# Patient Record
Sex: Female | Born: 1950 | Race: Black or African American | Hispanic: No | Marital: Married | Smoking: Never smoker
Health system: Southern US, Community
[De-identification: ages and names within clinical notes are randomized; demographics above are authoritative.]

## PROBLEM LIST (undated history)

## (undated) DIAGNOSIS — E119 Type 2 diabetes mellitus without complications: Secondary | ICD-10-CM

## (undated) DIAGNOSIS — E785 Hyperlipidemia, unspecified: Secondary | ICD-10-CM

## (undated) DIAGNOSIS — I10 Essential (primary) hypertension: Secondary | ICD-10-CM

## (undated) DIAGNOSIS — L309 Dermatitis, unspecified: Secondary | ICD-10-CM

## (undated) HISTORY — DX: Essential (primary) hypertension: I10

## (undated) HISTORY — DX: Type 2 diabetes mellitus without complications: E11.9

## (undated) HISTORY — DX: Hyperlipidemia, unspecified: E78.5

---

## 2000-02-10 ENCOUNTER — Encounter: Payer: Self-pay | Admitting: Family Medicine

## 2000-02-10 ENCOUNTER — Encounter: Admission: RE | Admit: 2000-02-10 | Discharge: 2000-02-10 | Payer: Self-pay | Admitting: Family Medicine

## 2000-10-12 ENCOUNTER — Other Ambulatory Visit: Admission: RE | Admit: 2000-10-12 | Discharge: 2000-10-12 | Payer: Self-pay | Admitting: Family Medicine

## 2001-03-14 ENCOUNTER — Encounter: Payer: Self-pay | Admitting: Family Medicine

## 2001-03-14 ENCOUNTER — Encounter: Admission: RE | Admit: 2001-03-14 | Discharge: 2001-03-14 | Payer: Self-pay | Admitting: Family Medicine

## 2003-05-28 ENCOUNTER — Other Ambulatory Visit: Admission: RE | Admit: 2003-05-28 | Discharge: 2003-05-28 | Payer: Self-pay | Admitting: Family Medicine

## 2003-08-01 ENCOUNTER — Emergency Department (HOSPITAL_COMMUNITY): Admission: EM | Admit: 2003-08-01 | Discharge: 2003-08-02 | Payer: Self-pay | Admitting: Emergency Medicine

## 2003-08-14 ENCOUNTER — Encounter (INDEPENDENT_AMBULATORY_CARE_PROVIDER_SITE_OTHER): Payer: Self-pay | Admitting: Specialist

## 2003-08-14 ENCOUNTER — Ambulatory Visit (HOSPITAL_COMMUNITY): Admission: RE | Admit: 2003-08-14 | Discharge: 2003-08-14 | Payer: Self-pay | Admitting: Gastroenterology

## 2003-10-16 ENCOUNTER — Emergency Department (HOSPITAL_COMMUNITY): Admission: EM | Admit: 2003-10-16 | Discharge: 2003-10-16 | Payer: Self-pay | Admitting: Emergency Medicine

## 2004-12-12 ENCOUNTER — Encounter: Admission: RE | Admit: 2004-12-12 | Discharge: 2004-12-12 | Payer: Self-pay | Admitting: Family Medicine

## 2005-01-05 ENCOUNTER — Other Ambulatory Visit: Admission: RE | Admit: 2005-01-05 | Discharge: 2005-01-05 | Payer: Self-pay | Admitting: Family Medicine

## 2005-12-29 ENCOUNTER — Encounter: Admission: RE | Admit: 2005-12-29 | Discharge: 2005-12-29 | Payer: Self-pay | Admitting: Family Medicine

## 2006-05-10 ENCOUNTER — Other Ambulatory Visit: Admission: RE | Admit: 2006-05-10 | Discharge: 2006-05-10 | Payer: Self-pay | Admitting: Family Medicine

## 2006-12-08 ENCOUNTER — Observation Stay (HOSPITAL_COMMUNITY): Admission: EM | Admit: 2006-12-08 | Discharge: 2006-12-09 | Payer: Self-pay | Admitting: Emergency Medicine

## 2007-01-03 ENCOUNTER — Encounter: Admission: RE | Admit: 2007-01-03 | Discharge: 2007-01-03 | Payer: Self-pay | Admitting: Family Medicine

## 2007-01-10 ENCOUNTER — Encounter: Admission: RE | Admit: 2007-01-10 | Discharge: 2007-01-10 | Payer: Self-pay | Admitting: Family Medicine

## 2007-05-16 ENCOUNTER — Other Ambulatory Visit: Admission: RE | Admit: 2007-05-16 | Discharge: 2007-05-16 | Payer: Self-pay | Admitting: Family Medicine

## 2008-03-09 ENCOUNTER — Encounter: Admission: RE | Admit: 2008-03-09 | Discharge: 2008-03-09 | Payer: Self-pay | Admitting: Family Medicine

## 2009-03-18 ENCOUNTER — Encounter: Admission: RE | Admit: 2009-03-18 | Discharge: 2009-03-18 | Payer: Self-pay | Admitting: Family Medicine

## 2010-03-28 ENCOUNTER — Encounter: Admission: RE | Admit: 2010-03-28 | Discharge: 2010-03-28 | Payer: Self-pay | Admitting: Family Medicine

## 2010-10-23 ENCOUNTER — Encounter: Payer: Self-pay | Admitting: Family Medicine

## 2011-02-17 NOTE — H&P (Signed)
Sandra Mccoy, Sandra Mccoy                  ACCOUNT NO.:  [id number]   MEDICAL RECORD NO.:  [id number]          PATIENT TYPE:  OBV   LOCATION:  1826                         FACILITY:  [facility]   PHYSICIAN:  Hollice Espy, M.D.DATE OF BIRTH:  09/17/1951   DATE OF ADMISSION:  12/08/2006  DATE OF DISCHARGE:                              HISTORY & PHYSICAL   PRIMARY CARE PHYSICIAN:  [name], M.D. of [place].   CHIEF COMPLAINT:  Chest discomfort.   HISTORY OF PRESENT ILLNESS:  The patient is a 60 year old African  American female with a past medical history of hypertension,  hyperlipidemia, and hypothyroidism who presents to the emergency room  with complaints of chest pain.  She previously had no other episodes  like this and starting about 3 this morning she woke up with complaints  of pressure located mid sternally radiating up and down.  She had no  associated shortness of breath but she did feel some nausea but no  vomiting, no diaphoresis, no arm or neck radiation.  Symptoms persisted  for about 3 hours and started to abate on her own.  She was finally  convinced by her family to come into the hospital.  They gave her some  nitroglycerin, although symptoms had already come close to resolving  anyway.   An emergency room EKG showed a normal sinus rhythm, essentially  unremarkable.  Cardiac markers were unremarkable as well.  However, she  was noted by lab work to have elevated transaminases with an AST of 643  and an ALT of 338.   REVIEW OF SYSTEMS:  Currently, the patient is doing well.  She denies  any headaches, vision changes, chest pain, palpitations, shortness of  breath, wheeze, cough, abdominal pain, hematuria, dysuria, constipation,  diarrhea, focal extremity numbness, weakness, or pain.  She feels at her  baseline.  Review of systems otherwise negative.   PAST MEDICAL HISTORY:  1. Hypertension.  2. Hyperlipidemia.  3. Hypothyroidism.   MEDICATIONS:  1. Maxzide 10.  2. Synthroid 75.  3. Zetia 10.   ALLERGIES:  SHE HAS NO KNOWN DRUG ALLERGIES.   SOCIAL HISTORY:  She denies any tobacco, alcohol, or drug use.   FAMILY HISTORY:  Noncontributory.   PHYSICAL EXAMINATION:  VITAL SIGNS:  On admission, temp 97.4, heart rate  71, blood pressure 150/75, respirations 16, O2 sat 99% on room air.  GENERAL:  The patient is alert and oriented x3 in no apparent distress.  HEENT:  Normocephalic atraumatic.  Her mucous membranes are moist.  She  has no carotid bruits.  HEART:  Regular rate and rhythm.  S1 S2.  LUNGS:  Clear to auscultation bilaterally.  ABDOMEN:  Soft, nontender, nondistended.  Positive bowel sounds.  EXTREMITIES:  Show no clubbing, cyanosis, or edema.   LABORATORY:  Chest x-ray is unremarkable.  Lipase 32.  LFTs are noted  for an AST 643, ALT 338, total bili 1.4.  Sodium 137, potassium 4,  chloride 108, bicarb 25, BUN 15, creatinine 1, glucose 101.  CPK 55.9,  MB less than 1, troponin I less than 0.05.  EKG and chest x-ray are as  per HPI.   ASSESSMENT AND PLAN:  1. Atypical chest pain.  Given the fact that she is describing      pressure makes me concerned at least to keep the patient overnight      for observation, when we check an EKG as well as serial cardiac      markers.  2. Elevated transaminases.  I question the cause of this given the      patient was changed over from Statin to Zetia about 6 months ago,      and she was intolerant of the Statin for nausea, vomiting, not      because of reported transaminases.  We will repeat a set in the      morning, as long as they do not continue to elevate, we will plan      to just recheck a set and then follow up with Dr. Azucena Cecil as an      outpatient.  If the patient is doing well with no further episodes      of chest pain, she can get an outpatient stress test and be      discharged tomorrow.  3. Hypothyroidism.  Continue Synthroid.  4. Hypertension.  Continue Maxzide.      Hollice Espy, M.D.  Electronically Signed     SKK/MEDQ  D:  12/08/2006  T:  12/08/2006  Job:  [id number]   cc:   [name], M.D.

## 2011-02-17 NOTE — Op Note (Signed)
   NAME:  Sandra Mccoy, Sandra Mccoy                            ACCOUNT NO.:  [id number]   MEDICAL RECORD NO.:  [id number]                   PATIENT TYPE:  AMB   LOCATION:  ENDO                                 [facility]ACILITY[facility]   PHYSICIAN:  John C. Madilyn Fireman, M.D.                 DATE OF BIRTH:  Feb 25, 1951   DATE OF PROCEDURE:  08/14/2003  DATE OF DISCHARGE:                                 OPERATIVE REPORT   PROCEDURE:  Colonoscopy with polypectomy.   INDICATIONS FOR PROCEDURE:  Colon cancer screening in a 60 year old patient  with no previous screening.   DESCRIPTION OF PROCEDURE:  The patient was placed in the left lateral  decubitus position then placed on the pulse monitor with continuous low flow  oxygen delivered by nasal cannula. She was sedated with 65 mcg IV fentanyl  and 7 mg IV Versed. The Olympus video colonoscope was inserted into the  rectum and advanced to the cecum, confirmed by transillumination at  McBurney's point and visualization of the ileocecal valve and appendiceal  orifice. The prep was excellent. The cecum, ascending, transverse,  descending and sigmoid colon all appeared normal with no masses, polyps,  diverticula or other mucosal abnormalities. Within the rectum, there was a 7  mm polyp which was fulgurated by hot biopsy. The remainder of the rectum  appeared normal. The scope was then withdrawn and the patient returned to  the recovery room in stable condition. The patient tolerated the procedure  well and there were no immediate complications.   IMPRESSION:  Rectal polyp otherwise normal study.   PLAN:  Await biopsy results to determine method and interval for future  colon screening.                                               John C. Madilyn Fireman, M.D.    JCH/MEDQ  D:  08/14/2003  T:  08/14/2003  Job:  [id number]   cc:   Meredith Staggers, M.D.  [address], [address]  [place]  [place] [address]  Fax: [phone]

## 2011-03-10 ENCOUNTER — Other Ambulatory Visit: Payer: Self-pay | Admitting: Family Medicine

## 2011-03-10 ENCOUNTER — Other Ambulatory Visit (HOSPITAL_COMMUNITY)
Admission: RE | Admit: 2011-03-10 | Discharge: 2011-03-10 | Disposition: A | Discharge status: Home / Self Care | Payer: 59 | Source: Ambulatory Visit | Attending: Family Medicine | Admitting: Family Medicine

## 2011-03-10 DIAGNOSIS — Z01419 Encounter for gynecological examination (general) (routine) without abnormal findings: Secondary | ICD-10-CM | POA: Insufficient documentation

## 2011-04-10 ENCOUNTER — Other Ambulatory Visit: Payer: Self-pay | Admitting: Family Medicine

## 2011-04-10 DIAGNOSIS — Z1231 Encounter for screening mammogram for malignant neoplasm of breast: Secondary | ICD-10-CM

## 2011-04-28 ENCOUNTER — Ambulatory Visit
Admission: RE | Admit: 2011-04-28 | Discharge: 2011-04-28 | Disposition: A | Discharge status: Home / Self Care | Payer: 59 | Source: Ambulatory Visit | Attending: Family Medicine | Admitting: Family Medicine

## 2011-04-28 DIAGNOSIS — Z1231 Encounter for screening mammogram for malignant neoplasm of breast: Secondary | ICD-10-CM

## 2012-04-05 ENCOUNTER — Other Ambulatory Visit: Payer: Self-pay | Admitting: Family Medicine

## 2012-04-05 DIAGNOSIS — Z1231 Encounter for screening mammogram for malignant neoplasm of breast: Secondary | ICD-10-CM

## 2012-04-29 ENCOUNTER — Ambulatory Visit
Admission: RE | Admit: 2012-04-29 | Discharge: 2012-04-29 | Disposition: A | Discharge status: Home / Self Care | Payer: 59 | Source: Ambulatory Visit | Attending: Family Medicine | Admitting: Family Medicine

## 2012-04-29 DIAGNOSIS — Z1231 Encounter for screening mammogram for malignant neoplasm of breast: Secondary | ICD-10-CM

## 2013-03-21 ENCOUNTER — Other Ambulatory Visit: Payer: Self-pay

## 2013-03-21 DIAGNOSIS — Z1231 Encounter for screening mammogram for malignant neoplasm of breast: Secondary | ICD-10-CM

## 2013-05-01 ENCOUNTER — Ambulatory Visit
Admission: RE | Admit: 2013-05-01 | Discharge: 2013-05-01 | Disposition: A | Discharge status: Home / Self Care | Payer: 59 | Source: Ambulatory Visit

## 2013-05-01 DIAGNOSIS — Z1231 Encounter for screening mammogram for malignant neoplasm of breast: Secondary | ICD-10-CM

## 2013-09-15 ENCOUNTER — Other Ambulatory Visit: Payer: Self-pay | Admitting: Gastroenterology

## 2014-03-17 ENCOUNTER — Other Ambulatory Visit: Payer: Self-pay | Admitting: Family Medicine

## 2014-03-17 ENCOUNTER — Other Ambulatory Visit (HOSPITAL_COMMUNITY)
Admission: RE | Admit: 2014-03-17 | Discharge: 2014-03-17 | Disposition: A | Discharge status: Home / Self Care | Payer: 59 | Source: Ambulatory Visit | Attending: Family Medicine | Admitting: Family Medicine

## 2014-03-17 DIAGNOSIS — Z01419 Encounter for gynecological examination (general) (routine) without abnormal findings: Secondary | ICD-10-CM | POA: Insufficient documentation

## 2014-03-18 LAB — CYTOLOGY - PAP

## 2014-04-16 ENCOUNTER — Encounter: Payer: 59 | Attending: Family Medicine

## 2014-04-16 VITALS — Ht 63.0 in | Wt 154.0 lb

## 2014-04-16 DIAGNOSIS — Z713 Dietary counseling and surveillance: Secondary | ICD-10-CM | POA: Diagnosis present

## 2014-04-16 DIAGNOSIS — E119 Type 2 diabetes mellitus without complications: Secondary | ICD-10-CM | POA: Diagnosis not present

## 2014-04-16 NOTE — Progress Notes (Signed)
Patient was seen on 04/16/2014 for the first of a series of three diabetes self-management courses at the [facility].  Current HbA1c: 6.5%  The following learning objectives were met by the patient during this class:  Describe diabetes  State some common risk factors for diabetes  Defines the role of glucose and insulin  Identifies type of diabetes and pathophysiology  Describe the relationship between diabetes and cardiovascular risk  State the members of the Healthcare Team  States the rationale for glucose monitoring  State when to test glucose  State their individual Target Range  State the importance of logging glucose readings  Describe how to interpret glucose readings  Identifies A1C target  Explain the correlation between A1c and eAG values  State symptoms and treatment of high blood glucose  State symptoms and treatment of low blood glucose  Explain proper technique for glucose testing  Identifies proper sharps disposal  Handouts given during class include:  Living Well with Diabetes book  Carb Counting and Meal Planning book  Meal Plan Card  Carbohydrate guide  Meal planning worksheet  Low Sodium Flavoring Tips  The diabetes portion plate  P3W to eAG Conversion Chart  Diabetes Medications  Diabetes Recommended Care Schedule  Support Group  Diabetes Success Plan  Core Class Satisfaction Survey  Follow-Up Plan:  Attend core 2

## 2014-04-20 ENCOUNTER — Other Ambulatory Visit: Payer: Self-pay

## 2014-04-20 DIAGNOSIS — Z1231 Encounter for screening mammogram for malignant neoplasm of breast: Secondary | ICD-10-CM

## 2014-04-23 DIAGNOSIS — E119 Type 2 diabetes mellitus without complications: Secondary | ICD-10-CM

## 2014-04-23 NOTE — Progress Notes (Signed)

## 2014-04-30 DIAGNOSIS — E119 Type 2 diabetes mellitus without complications: Secondary | ICD-10-CM | POA: Diagnosis not present

## 2014-04-30 NOTE — Progress Notes (Signed)
Patient was seen on 04/30/14 for the third of a series of three diabetes self-management courses at the [facility]. The following learning objectives were met by the patient during this class:    State the amount of activity recommended for healthy living   Describe activities suitable for individual needs   Identify ways to regularly incorporate activity into daily life   Identify barriers to activity and ways to over come these barriers  Identify diabetes medications being personally used and their primary action for lowering glucose and possible side effects   Describe role of stress on blood glucose and develop strategies to address psychosocial issues   Identify diabetes complications and ways to prevent them  Explain how to manage diabetes during illness   Evaluate success in meeting personal goal   Establish 2-3 goals that they will plan to diligently work on until they return for the  71-monthfollow-up visit  Goals:  Follow Diabetes Meal Plan as instructed  Aim for 15-30 mins of physical activity daily as tolerated  Bring food record and glucose log to your follow up visit  Your patient has established the following 4 month goals in their individualized success plan: I will count my carb choices at most meals and snacks To help manage stress I will do reflect at least 1 times a week  Your patient has identified these potential barriers to change:  None stated  Your patient has identified their diabetes self-care support plan as  [facility]   Plan:  Attend Core 4 in 4 months

## 2014-05-04 ENCOUNTER — Ambulatory Visit
Admission: RE | Admit: 2014-05-04 | Discharge: 2014-05-04 | Disposition: A | Discharge status: Home / Self Care | Payer: 59 | Source: Ambulatory Visit

## 2014-05-04 DIAGNOSIS — Z1231 Encounter for screening mammogram for malignant neoplasm of breast: Secondary | ICD-10-CM

## 2014-08-03 ENCOUNTER — Ambulatory Visit: Payer: 59

## 2015-04-08 ENCOUNTER — Other Ambulatory Visit: Payer: Self-pay

## 2015-04-08 DIAGNOSIS — Z1231 Encounter for screening mammogram for malignant neoplasm of breast: Secondary | ICD-10-CM

## 2015-05-06 ENCOUNTER — Ambulatory Visit
Admission: RE | Admit: 2015-05-06 | Discharge: 2015-05-06 | Disposition: A | Discharge status: Home / Self Care | Payer: 59 | Source: Ambulatory Visit

## 2015-05-06 DIAGNOSIS — Z1231 Encounter for screening mammogram for malignant neoplasm of breast: Secondary | ICD-10-CM

## 2016-05-03 ENCOUNTER — Other Ambulatory Visit: Payer: Self-pay | Admitting: Family Medicine

## 2016-05-03 DIAGNOSIS — Z1231 Encounter for screening mammogram for malignant neoplasm of breast: Secondary | ICD-10-CM

## 2016-05-11 ENCOUNTER — Ambulatory Visit
Admission: RE | Admit: 2016-05-11 | Discharge: 2016-05-11 | Disposition: A | Discharge status: Home / Self Care | Payer: 59 | Source: Ambulatory Visit | Attending: Family Medicine | Admitting: Family Medicine

## 2016-05-11 DIAGNOSIS — Z1231 Encounter for screening mammogram for malignant neoplasm of breast: Secondary | ICD-10-CM

## 2017-04-25 ENCOUNTER — Other Ambulatory Visit: Payer: Self-pay | Admitting: Family Medicine

## 2017-04-25 DIAGNOSIS — Z1231 Encounter for screening mammogram for malignant neoplasm of breast: Secondary | ICD-10-CM

## 2017-05-16 ENCOUNTER — Ambulatory Visit: Payer: 59

## 2017-05-30 ENCOUNTER — Ambulatory Visit
Admission: RE | Admit: 2017-05-30 | Discharge: 2017-05-30 | Disposition: A | Discharge status: Home / Self Care | Payer: Medicare Other | Source: Ambulatory Visit | Attending: Family Medicine | Admitting: Family Medicine

## 2017-05-30 DIAGNOSIS — Z1231 Encounter for screening mammogram for malignant neoplasm of breast: Secondary | ICD-10-CM

## 2017-07-13 ENCOUNTER — Encounter: Payer: Self-pay | Admitting: Emergency Medicine

## 2017-07-13 ENCOUNTER — Ambulatory Visit (INDEPENDENT_AMBULATORY_CARE_PROVIDER_SITE_OTHER): Payer: Medicare Other | Admitting: Emergency Medicine

## 2017-07-13 VITALS — BP 126/64 | HR 111 | Temp 98.6°F | Resp 16 | Ht 62.5 in | Wt 160.0 lb

## 2017-07-13 DIAGNOSIS — J069 Acute upper respiratory infection, unspecified: Secondary | ICD-10-CM

## 2017-07-13 DIAGNOSIS — I10 Essential (primary) hypertension: Secondary | ICD-10-CM

## 2017-07-13 DIAGNOSIS — R05 Cough: Secondary | ICD-10-CM

## 2017-07-13 DIAGNOSIS — E119 Type 2 diabetes mellitus without complications: Secondary | ICD-10-CM | POA: Diagnosis not present

## 2017-07-13 DIAGNOSIS — R059 Cough, unspecified: Secondary | ICD-10-CM | POA: Insufficient documentation

## 2017-07-13 DIAGNOSIS — J029 Acute pharyngitis, unspecified: Secondary | ICD-10-CM | POA: Diagnosis not present

## 2017-07-13 DIAGNOSIS — E785 Hyperlipidemia, unspecified: Secondary | ICD-10-CM | POA: Diagnosis not present

## 2017-07-13 MED ORDER — PROMETHAZINE-CODEINE 6.25-10 MG/5ML PO SYRP: 5.0000 mL | ORAL_SOLUTION | Freq: Every evening | ORAL | 0 refills | Status: DC | PRN

## 2017-07-13 NOTE — Progress Notes (Signed)
Sandra Mccoy 66 y.o.   Chief Complaint  Patient presents with  . Cough    X 2-3 DAYS  . Sore Throat    HISTORY OF PRESENT ILLNESS: This is a 66 y.o. female complaining of sore throat and cough x 2-3 days.  Sore Throat   This is a new problem. The current episode started in the past 7 days. The problem has been gradually improving. There has been no fever. The pain is at a severity of 3/10. The pain is mild. Associated symptoms include coughing. Pertinent negatives include no abdominal pain, congestion, diarrhea, ear pain, headaches, neck pain, shortness of breath, swollen glands, trouble swallowing or vomiting. She has had no exposure to strep or mono. She has tried nothing for the symptoms.     Prior to Admission medications   Not on File    Allergies  Allergen Reactions  . Lipitor [Atorvastatin] Nausea And Vomiting    There are no active problems to display for this patient.   Past Medical History:  Diagnosis Date  . Diabetes mellitus without complication (HCC)   . Hyperlipidemia   . Hypertension     No past surgical history on file.  Social History   Social History  . Marital status: Married    Spouse name: N/A  . Number of children: N/A  . Years of education: N/A   Occupational History  . Not on file.   Social History Main Topics  . Smoking status: Never Smoker  . Smokeless tobacco: Never Used  . Alcohol use No  . Drug use: No  . Sexual activity: Not on file   Other Topics Concern  . Not on file   Social History Narrative  . No narrative on file    No family history on file.   Review of Systems  Constitutional: Negative.  Negative for chills, fever and malaise/fatigue.  HENT: Positive for sore throat. Negative for congestion, ear pain, nosebleeds and trouble swallowing.   Eyes: Negative for discharge and redness.  Respiratory: Positive for cough. Negative for hemoptysis and shortness of breath.   Cardiovascular: Negative for chest pain,  palpitations, claudication and leg swelling.  Gastrointestinal: Negative for abdominal pain, blood in stool, diarrhea, nausea and vomiting.  Genitourinary: Negative for dysuria, flank pain and hematuria.  Musculoskeletal: Negative for joint pain, myalgias and neck pain.  Skin: Negative for rash.  Neurological: Negative for dizziness, sensory change, focal weakness and headaches.  All other systems reviewed and are negative.    Vitals:   07/13/17 1352  BP: 126/64  Pulse: (!) 111  Resp: 16  Temp: 98.6 F (37 C)  SpO2: 97%     Physical Exam  Constitutional: She is oriented to person, place, and time. She appears well-developed and well-nourished.  HENT:  Head: Normocephalic and atraumatic.  Mouth/Throat: Uvula is midline and mucous membranes are normal. Posterior oropharyngeal erythema present. No oropharyngeal exudate or posterior oropharyngeal edema.  Eyes: Pupils are equal, round, and reactive to light. Conjunctivae and EOM are normal.  Neck: Normal range of motion. Neck supple. No JVD present. No thyromegaly present.  Cardiovascular: Normal rate, regular rhythm, normal heart sounds and intact distal pulses.   Pulmonary/Chest: Effort normal and breath sounds normal.  Abdominal: Soft.  Lymphadenopathy:    She has no cervical adenopathy.  Neurological: She is alert and oriented to person, place, and time. No sensory deficit. She exhibits normal muscle tone.  Skin: Skin is warm and dry. Capillary refill takes less than 2  seconds. No rash noted.  Psychiatric: She has a normal mood and affect. Her behavior is normal.  Vitals reviewed.    ASSESSMENT & PLAN: Sandra Mccoy was seen today for cough and sore throat.  Diagnoses and all orders for this visit:  Upper respiratory tract infection, unspecified type  Sore throat  Cough  Other orders -     promethazine-codeine (PHENERGAN WITH CODEINE) 6.25-10 MG/5ML syrup; Take 5 mLs by mouth at bedtime as needed for cough.    Patient  Instructions  Upper Respiratory Infection, Adult Most upper respiratory infections (URIs) are caused by a virus. A URI affects the nose, throat, and upper air passages. The most common type of URI is often called "the common cold." Follow these instructions at home:  Take medicines only as told by your doctor.  Gargle warm saltwater or take cough drops to comfort your throat as told by your doctor.  Use a warm mist humidifier or inhale steam from a shower to increase air moisture. This may make it easier to breathe.  Drink enough fluid to keep your pee (urine) clear or pale yellow.  Eat soups and other clear broths.  Have a healthy diet.  Rest as needed.  Go back to work when your fever is gone or your doctor says it is okay. ? You may need to stay home longer to avoid giving your URI to others. ? You can also wear a face mask and wash your hands often to prevent spread of the virus.  Use your inhaler more if you have asthma.  Do not use any tobacco products, including cigarettes, chewing tobacco, or electronic cigarettes. If you need help quitting, ask your doctor. Contact a doctor if:  You are getting worse, not better.  Your symptoms are not helped by medicine.  You have chills.  You are getting more short of breath.  You have brown or red mucus.  You have yellow or brown discharge from your nose.  You have pain in your face, especially when you bend forward.  You have a fever.  You have puffy (swollen) neck glands.  You have pain while swallowing.  You have white areas in the back of your throat. Get help right away if:  You have very bad or constant: ? Headache. ? Ear pain. ? Pain in your forehead, behind your eyes, and over your cheekbones (sinus pain). ? Chest pain.  You have long-lasting (chronic) lung disease and any of the following: ? Wheezing. ? Long-lasting cough. ? Coughing up blood. ? A change in your usual mucus.  You have a stiff  neck.  You have changes in your: ? Vision. ? Hearing. ? Thinking. ? Mood. This information is not intended to replace advice given to you by your health care provider. Make sure you discuss any questions you have with your health care provider. Document Released: 03/06/2008 Document Revised: 05/21/2016 Document Reviewed: 12/24/2013 Elsevier Interactive Patient Education  2018 Elsevier Inc.      Edwina Barth, MD [facility]

## 2017-07-13 NOTE — Patient Instructions (Signed)
Upper Respiratory Infection, Adult Most upper respiratory infections (URIs) are caused by a virus. A URI affects the nose, throat, and upper air passages. The most common type of URI is often called "the common cold." Follow these instructions at home:  Take medicines only as told by your doctor.  Gargle warm saltwater or take cough drops to comfort your throat as told by your doctor.  Use a warm mist humidifier or inhale steam from a shower to increase air moisture. This may make it easier to breathe.  Drink enough fluid to keep your pee (urine) clear or pale yellow.  Eat soups and other clear broths.  Have a healthy diet.  Rest as needed.  Go back to work when your fever is gone or your doctor says it is okay. ? You may need to stay home longer to avoid giving your URI to others. ? You can also wear a face mask and wash your hands often to prevent spread of the virus.  Use your inhaler more if you have asthma.  Do not use any tobacco products, including cigarettes, chewing tobacco, or electronic cigarettes. If you need help quitting, ask your doctor. Contact a doctor if:  You are getting worse, not better.  Your symptoms are not helped by medicine.  You have chills.  You are getting more short of breath.  You have brown or red mucus.  You have yellow or brown discharge from your nose.  You have pain in your face, especially when you bend forward.  You have a fever.  You have puffy (swollen) neck glands.  You have pain while swallowing.  You have white areas in the back of your throat. Get help right away if:  You have very bad or constant: ? Headache. ? Ear pain. ? Pain in your forehead, behind your eyes, and over your cheekbones (sinus pain). ? Chest pain.  You have long-lasting (chronic) lung disease and any of the following: ? Wheezing. ? Long-lasting cough. ? Coughing up blood. ? A change in your usual mucus.  You have a stiff neck.  You have  changes in your: ? Vision. ? Hearing. ? Thinking. ? Mood. This information is not intended to replace advice given to you by your health care provider. Make sure you discuss any questions you have with your health care provider. Document Released: 03/06/2008 Document Revised: 05/21/2016 Document Reviewed: 12/24/2013 Elsevier Interactive Patient Education  2018 Elsevier Inc.  

## 2018-04-22 ENCOUNTER — Other Ambulatory Visit: Payer: Self-pay | Admitting: Family Medicine

## 2018-04-22 DIAGNOSIS — Z1231 Encounter for screening mammogram for malignant neoplasm of breast: Secondary | ICD-10-CM

## 2018-06-12 ENCOUNTER — Ambulatory Visit
Admission: RE | Admit: 2018-06-12 | Discharge: 2018-06-12 | Disposition: A | Discharge status: Home / Self Care | Payer: Medicare Other | Source: Ambulatory Visit | Attending: Family Medicine | Admitting: Family Medicine

## 2018-06-12 ENCOUNTER — Encounter: Payer: Self-pay | Admitting: Radiology

## 2018-06-12 DIAGNOSIS — Z1231 Encounter for screening mammogram for malignant neoplasm of breast: Secondary | ICD-10-CM

## 2019-03-25 ENCOUNTER — Other Ambulatory Visit: Payer: Self-pay | Admitting: *Deleted

## 2019-03-25 DIAGNOSIS — Z20822 Contact with and (suspected) exposure to covid-19: Secondary | ICD-10-CM

## 2019-03-28 LAB — NOVEL CORONAVIRUS, NAA: SARS-CoV-2, NAA: NOT DETECTED

## 2019-06-10 ENCOUNTER — Other Ambulatory Visit: Payer: Self-pay | Admitting: Family Medicine

## 2019-06-10 DIAGNOSIS — Z1231 Encounter for screening mammogram for malignant neoplasm of breast: Secondary | ICD-10-CM

## 2019-07-22 ENCOUNTER — Ambulatory Visit
Admission: RE | Admit: 2019-07-22 | Discharge: 2019-07-22 | Disposition: A | Discharge status: Home / Self Care | Payer: Medicare Other | Source: Ambulatory Visit | Attending: Family Medicine | Admitting: Family Medicine

## 2019-07-22 ENCOUNTER — Other Ambulatory Visit: Payer: Self-pay

## 2019-07-22 DIAGNOSIS — Z1231 Encounter for screening mammogram for malignant neoplasm of breast: Secondary | ICD-10-CM

## 2019-08-07 ENCOUNTER — Other Ambulatory Visit: Payer: Self-pay

## 2019-08-07 DIAGNOSIS — Z20822 Contact with and (suspected) exposure to covid-19: Secondary | ICD-10-CM

## 2019-08-09 LAB — NOVEL CORONAVIRUS, NAA: SARS-CoV-2, NAA: NOT DETECTED

## 2019-08-18 ENCOUNTER — Telehealth: Payer: Self-pay | Admitting: *Deleted

## 2019-08-18 NOTE — Telephone Encounter (Signed)
Patient called and was given negative COVID results . 

## 2019-10-26 ENCOUNTER — Ambulatory Visit: Payer: Medicare Other | Attending: Internal Medicine

## 2019-10-26 DIAGNOSIS — Z23 Encounter for immunization: Secondary | ICD-10-CM | POA: Insufficient documentation

## 2019-10-27 NOTE — Progress Notes (Signed)
   Covid-19 Vaccination Clinic  Name:  Sandra Mccoy    MRN: [id number] DOB: 02-19-1951  10/26/2019  Sandra Mccoy was observed post Covid-19 immunization for 15 minutes without incidence. She was provided with Vaccine Information Sheet and instruction to access the V-Safe system.   Sandra Mccoy was instructed to call 911 with any severe reactions post vaccine: Marland Kitchen Difficulty breathing  . Swelling of your face and throat  . A fast heartbeat  . A bad rash all over your body  . Dizziness and weakness    Immunizations Administered    Name Date Dose VIS Date Route   Moderna COVID-19 Vaccine 10/26/2019  3:03 PM 0.5 mL 09/02/2019 Intramuscular   Manufacturer: Gala Murdoch   Lot: [id number]   NDC: 08022-336-12      Documented on behalf of: C. Jimmey Ralph

## 2019-11-23 ENCOUNTER — Ambulatory Visit: Payer: Medicare Other | Attending: Internal Medicine

## 2019-11-23 DIAGNOSIS — Z23 Encounter for immunization: Secondary | ICD-10-CM | POA: Insufficient documentation

## 2019-11-23 NOTE — Progress Notes (Signed)
   Covid-19 Vaccination Clinic  Name:  Sandra Mccoy    MRN: [id number] DOB: 03/06/1951  11/23/2019  [name] was observed post Covid-19 immunization for 15 minutes without incidence. She was provided with Vaccine Information Sheet and instruction to access the V-Safe system.   [name] was instructed to call 911 with any severe reactions post vaccine: Marland Kitchen Difficulty breathing  . Swelling of your face and throat  . A fast heartbeat  . A bad rash all over your body  . Dizziness and weakness    Immunizations Administered    Name Date Dose VIS Date Route   Moderna COVID-19 Vaccine 11/23/2019  1:15 PM 0.5 mL 09/02/2019 Intramuscular   Manufacturer: Moderna   Lot: [id number]   NDC: 77373-668-15

## 2019-11-27 ENCOUNTER — Other Ambulatory Visit: Payer: Self-pay | Admitting: Family Medicine

## 2019-11-27 ENCOUNTER — Ambulatory Visit
Admission: RE | Admit: 2019-11-27 | Discharge: 2019-11-27 | Disposition: A | Discharge status: Home / Self Care | Payer: Medicare Other | Source: Ambulatory Visit | Attending: Family Medicine | Admitting: Family Medicine

## 2019-11-27 DIAGNOSIS — R319 Hematuria, unspecified: Secondary | ICD-10-CM

## 2019-11-27 IMAGING — US US RENAL
1 series · 14 of 25 positions shown · non-contrast
Comparison: None.

CLINICAL DATA: Hematuria.

EXAM:
RENAL / URINARY TRACT ULTRASOUND COMPLETE

[Series 1: us renal · 0.20mm/px · 14 of 32 slices shown]
[im 1/32]
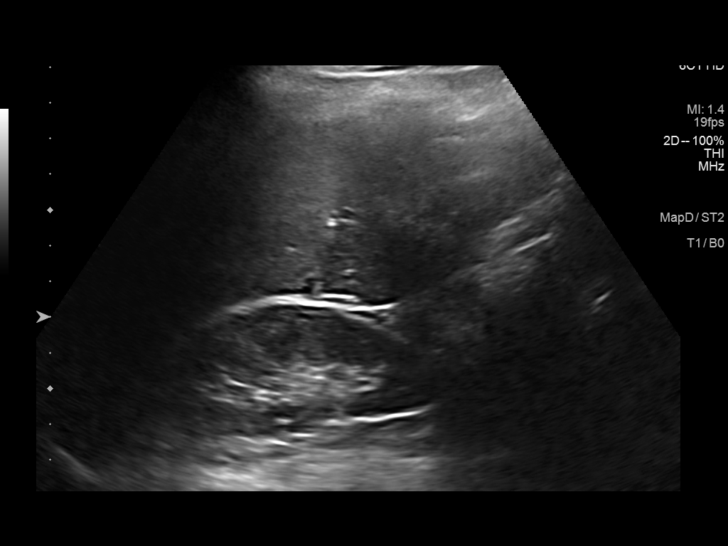
[im 3/32]
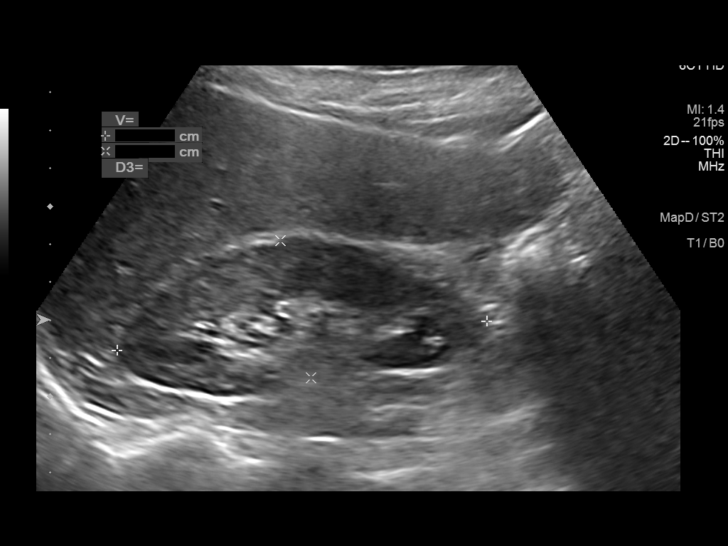
[im 6/32]
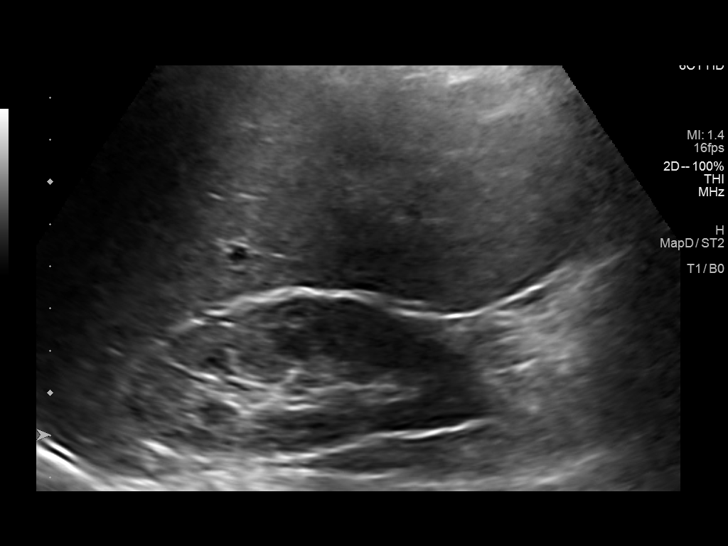
[im 8/32]
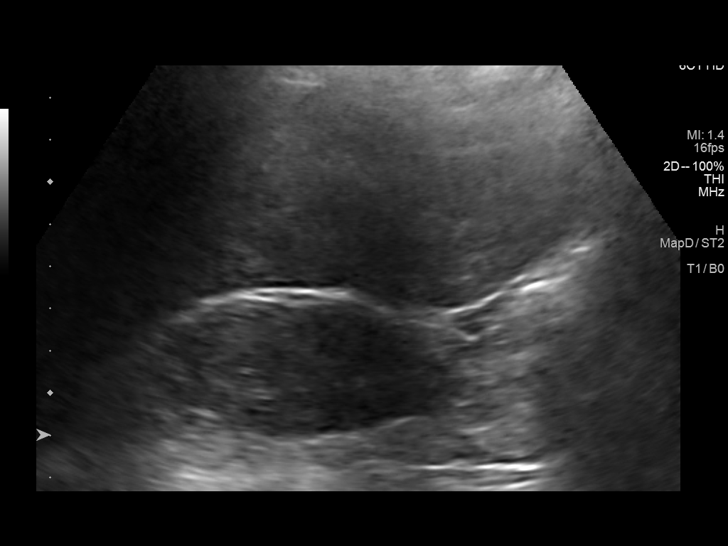
[im 11/32]
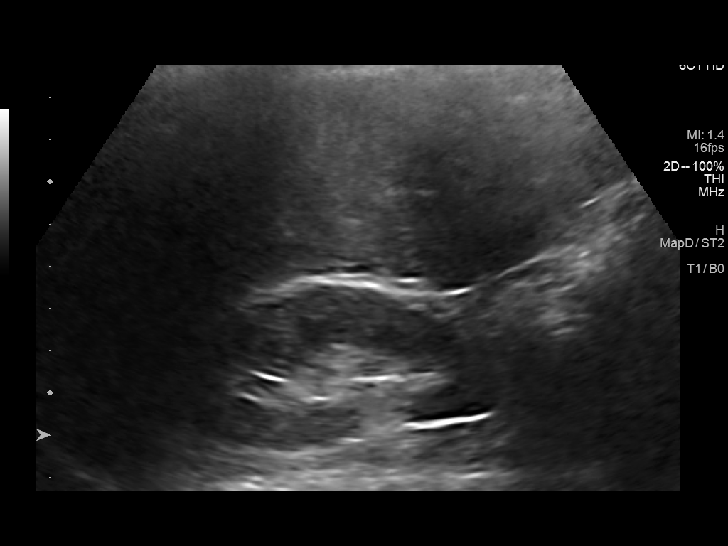
[im 12/32]
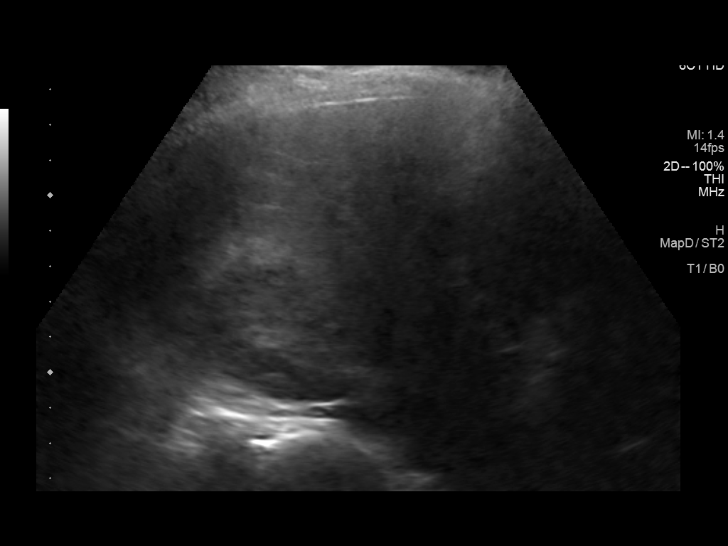
[im 15/32]
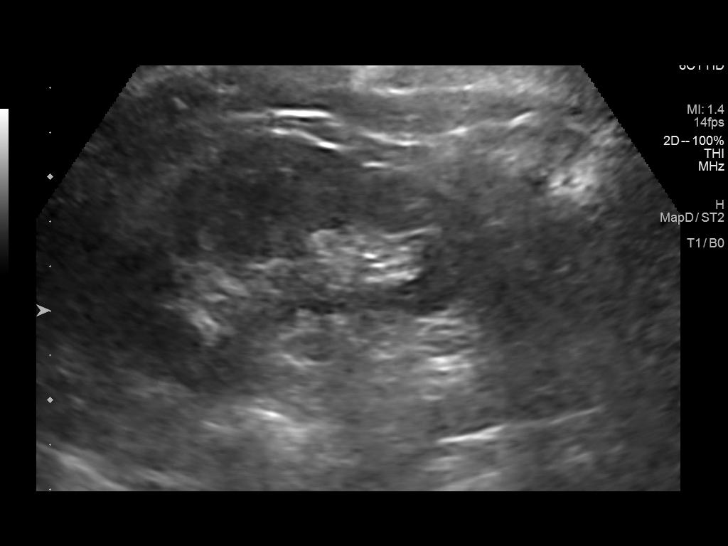
[im 17/32]
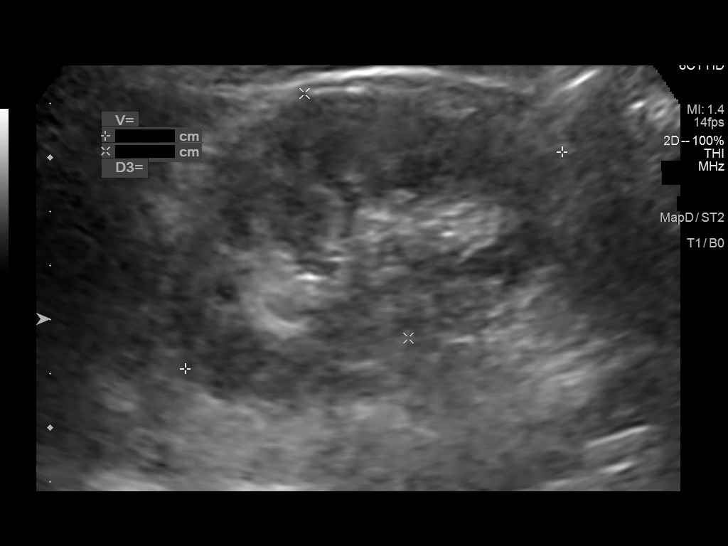
[im 20/32]
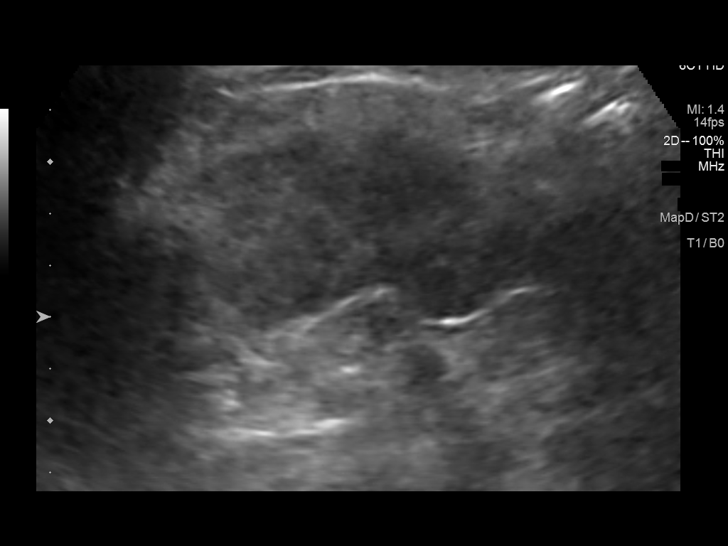
[im 21/32]
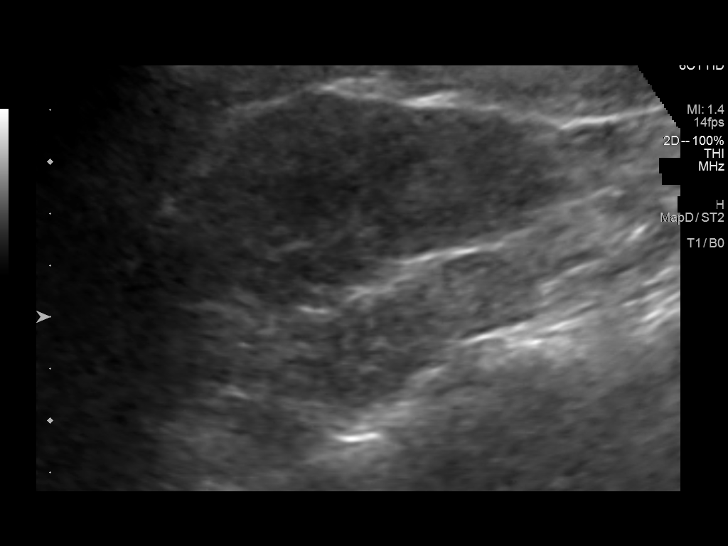
[im 24/32]
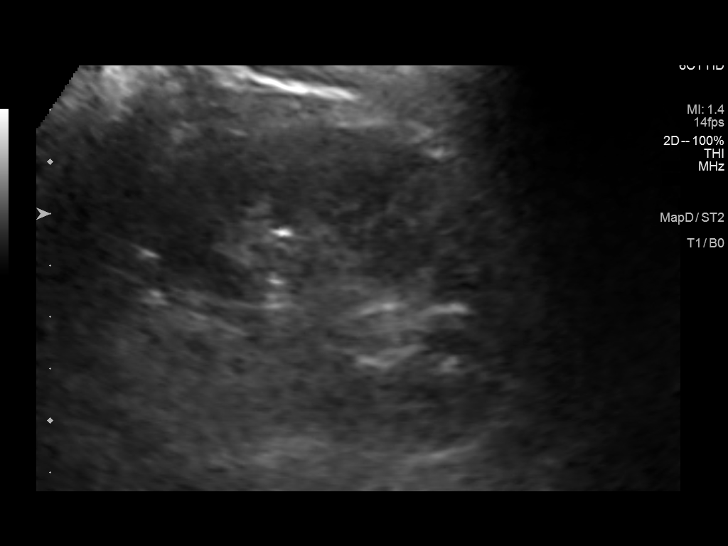
[im 26/32]
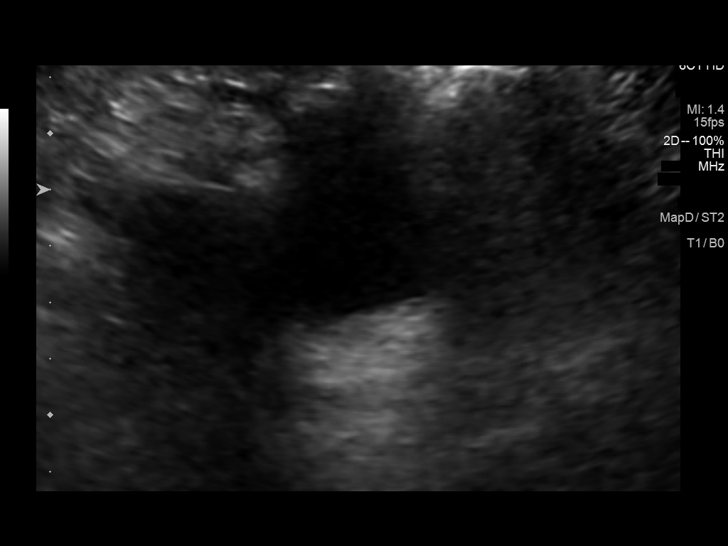
[im 29/32]
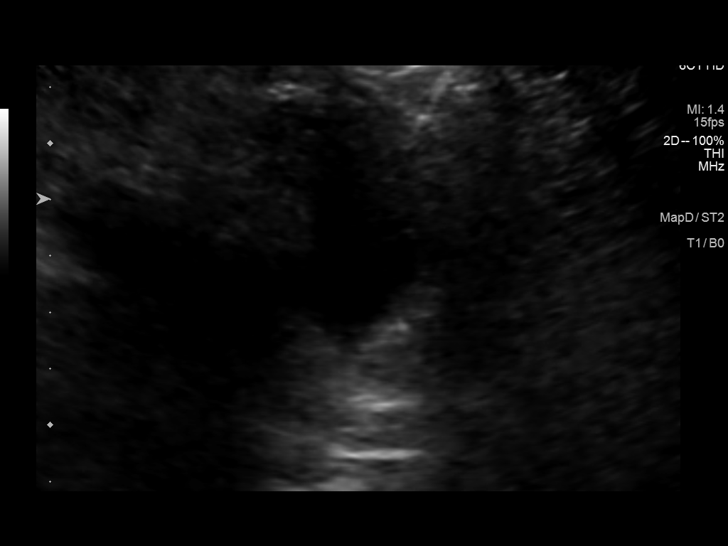
[im 32/32]
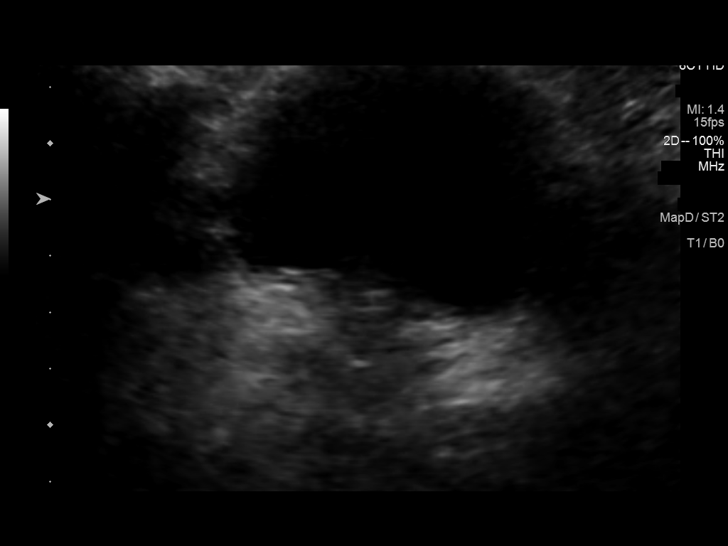

[14 of 25 positions shown; findings below may reference images not displayed]

FINDINGS: Right Kidney:

Renal measurements: 9.8 x 3.7 x 4.7 cm = volume: 89.6 mL .
Echogenicity within normal limits. No mass or hydronephrosis
visualized.

Left Kidney:

Renal measurements: 8.1 x 4.9 x 4.8 cm = volume: 98.9 mL.
Echogenicity within normal limits. No mass or hydronephrosis
visualized.

Bladder:

Appears normal for degree of bladder distention.

Other:

None.
IMPRESSION: Normal study.  No acute abnormality detected.

## 2020-06-29 ENCOUNTER — Other Ambulatory Visit: Payer: Self-pay | Admitting: Family Medicine

## 2020-06-29 DIAGNOSIS — Z0000 Encounter for general adult medical examination without abnormal findings: Secondary | ICD-10-CM

## 2020-07-23 ENCOUNTER — Ambulatory Visit
Admission: RE | Admit: 2020-07-23 | Discharge: 2020-07-23 | Disposition: A | Discharge status: Home / Self Care | Payer: Medicare Other | Source: Ambulatory Visit | Attending: Family Medicine | Admitting: Family Medicine

## 2020-07-23 ENCOUNTER — Other Ambulatory Visit: Payer: Self-pay

## 2020-07-23 DIAGNOSIS — Z0000 Encounter for general adult medical examination without abnormal findings: Secondary | ICD-10-CM

## 2021-01-01 ENCOUNTER — Inpatient Hospital Stay (HOSPITAL_BASED_OUTPATIENT_CLINIC_OR_DEPARTMENT_OTHER)
Admission: EM | Admit: 2021-01-01 | Discharge: 2021-01-04 | DRG: 418 | Disposition: A | Discharge status: Home / Self Care | Payer: Medicare Other | Attending: Internal Medicine | Admitting: Internal Medicine

## 2021-01-01 ENCOUNTER — Encounter (HOSPITAL_BASED_OUTPATIENT_CLINIC_OR_DEPARTMENT_OTHER): Payer: Self-pay | Admitting: Emergency Medicine

## 2021-01-01 ENCOUNTER — Emergency Department (HOSPITAL_BASED_OUTPATIENT_CLINIC_OR_DEPARTMENT_OTHER): Payer: Medicare Other

## 2021-01-01 ENCOUNTER — Other Ambulatory Visit: Payer: Self-pay

## 2021-01-01 DIAGNOSIS — K8020 Calculus of gallbladder without cholecystitis without obstruction: Secondary | ICD-10-CM

## 2021-01-01 DIAGNOSIS — E785 Hyperlipidemia, unspecified: Secondary | ICD-10-CM | POA: Diagnosis present

## 2021-01-01 DIAGNOSIS — Z20822 Contact with and (suspected) exposure to covid-19: Secondary | ICD-10-CM | POA: Diagnosis present

## 2021-01-01 DIAGNOSIS — K819 Cholecystitis, unspecified: Secondary | ICD-10-CM | POA: Diagnosis present

## 2021-01-01 DIAGNOSIS — E039 Hypothyroidism, unspecified: Secondary | ICD-10-CM | POA: Diagnosis present

## 2021-01-01 DIAGNOSIS — K810 Acute cholecystitis: Secondary | ICD-10-CM | POA: Diagnosis present

## 2021-01-01 DIAGNOSIS — E119 Type 2 diabetes mellitus without complications: Secondary | ICD-10-CM | POA: Diagnosis present

## 2021-01-01 DIAGNOSIS — K429 Umbilical hernia without obstruction or gangrene: Secondary | ICD-10-CM | POA: Diagnosis present

## 2021-01-01 DIAGNOSIS — Z79899 Other long term (current) drug therapy: Secondary | ICD-10-CM

## 2021-01-01 DIAGNOSIS — E871 Hypo-osmolality and hyponatremia: Secondary | ICD-10-CM | POA: Diagnosis present

## 2021-01-01 DIAGNOSIS — R1011 Right upper quadrant pain: Secondary | ICD-10-CM

## 2021-01-01 DIAGNOSIS — I10 Essential (primary) hypertension: Secondary | ICD-10-CM | POA: Diagnosis present

## 2021-01-01 DIAGNOSIS — Z79890 Hormone replacement therapy (postmenopausal): Secondary | ICD-10-CM

## 2021-01-01 DIAGNOSIS — Z419 Encounter for procedure for purposes other than remedying health state, unspecified: Secondary | ICD-10-CM

## 2021-01-01 DIAGNOSIS — K8012 Calculus of gallbladder with acute and chronic cholecystitis without obstruction: Principal | ICD-10-CM | POA: Diagnosis present

## 2021-01-01 HISTORY — DX: Acute cholecystitis: K81.0

## 2021-01-01 LAB — CBC WITH DIFFERENTIAL/PLATELET
Abs Immature Granulocytes: 0.03 10*3/uL (ref 0.00–0.07)
Basophils Absolute: 0 10*3/uL (ref 0.0–0.1)
Basophils Relative: 0 %
Eosinophils Absolute: 0 10*3/uL (ref 0.0–0.5)
Eosinophils Relative: 0 %
HCT: 39.1 % (ref 36.0–46.0)
Hemoglobin: 13.6 g/dL (ref 12.0–15.0)
Immature Granulocytes: 0 %
Lymphocytes Relative: 21 %
Lymphs Abs: 2.2 10*3/uL (ref 0.7–4.0)
MCH: 31.6 pg (ref 26.0–34.0)
MCHC: 34.8 g/dL (ref 30.0–36.0)
MCV: 90.7 fL (ref 80.0–100.0)
Monocytes Absolute: 0.5 10*3/uL (ref 0.1–1.0)
Monocytes Relative: 5 %
Neutro Abs: 7.7 10*3/uL (ref 1.7–7.7)
Neutrophils Relative %: 74 %
Platelets: 235 10*3/uL (ref 150–400)
RBC: 4.31 MIL/uL (ref 3.87–5.11)
RDW: 12.9 % (ref 11.5–15.5)
WBC: 10.4 10*3/uL (ref 4.0–10.5)
nRBC: 0 % (ref 0.0–0.2)

## 2021-01-01 LAB — COMPREHENSIVE METABOLIC PANEL
ALT: 184 U/L — ABNORMAL HIGH (ref 0–44)
AST: 96 U/L — ABNORMAL HIGH (ref 15–41)
Albumin: 4.3 g/dL (ref 3.5–5.0)
Alkaline Phosphatase: 136 U/L — ABNORMAL HIGH (ref 38–126)
Anion gap: 12 (ref 5–15)
BUN: 16 mg/dL (ref 8–23)
CO2: 26 mmol/L (ref 22–32)
Calcium: 10.9 mg/dL — ABNORMAL HIGH (ref 8.9–10.3)
Chloride: 96 mmol/L — ABNORMAL LOW (ref 98–111)
Creatinine, Ser: 0.95 mg/dL (ref 0.44–1.00)
GFR, Estimated: >60 mL/min (ref >60)
Glucose, Bld: 149 mg/dL — ABNORMAL HIGH (ref 70–99)
Potassium: 4.2 mmol/L (ref 3.5–5.1)
Sodium: 134 mmol/L — ABNORMAL LOW (ref 135–145)
Total Bilirubin: 1 mg/dL (ref 0.3–1.2)
Total Protein: 8.5 g/dL — ABNORMAL HIGH (ref 6.5–8.1)

## 2021-01-01 LAB — RESP PANEL BY RT-PCR (FLU A&B, COVID) ARPGX2
Influenza A by PCR: NEGATIVE
Influenza B by PCR: NEGATIVE
SARS Coronavirus 2 by RT PCR: NEGATIVE

## 2021-01-01 LAB — CBG MONITORING, ED: Glucose-Capillary: 109 mg/dL — ABNORMAL HIGH (ref 70–99)

## 2021-01-01 LAB — URINALYSIS, MICROSCOPIC (REFLEX)

## 2021-01-01 LAB — URINALYSIS, ROUTINE W REFLEX MICROSCOPIC
Glucose, UA: NEGATIVE mg/dL
Hgb urine dipstick: NEGATIVE
Ketones, ur: >80 mg/dL — AB
Nitrite: NEGATIVE
Protein, ur: NEGATIVE mg/dL
Specific Gravity, Urine: 1.025 (ref 1.005–1.030)
pH: 7 (ref 5.0–8.0)

## 2021-01-01 LAB — LIPASE, BLOOD: Lipase: 31 U/L (ref 11–51)

## 2021-01-01 MED ORDER — ONDANSETRON HCL 4 MG/2ML IJ SOLN
4.0000 mg | Freq: Once | INTRAMUSCULAR | Status: AC
  Administered 2021-01-01: 4 mg via INTRAVENOUS
  Filled 2021-01-01: qty 2

## 2021-01-01 MED ORDER — ONDANSETRON HCL 4 MG PO TABS: 4.0000 mg | ORAL_TABLET | Freq: Four times a day (QID) | ORAL | Status: DC | PRN

## 2021-01-01 MED ORDER — OXYCODONE HCL 5 MG PO TABS: 5.0000 mg | ORAL_TABLET | ORAL | Status: DC | PRN

## 2021-01-01 MED ORDER — MORPHINE SULFATE (PF) 2 MG/ML IV SOLN: 2.0000 mg | INTRAVENOUS | Status: DC | PRN

## 2021-01-01 MED ORDER — HYDRALAZINE HCL 20 MG/ML IJ SOLN: 10.0000 mg | Freq: Four times a day (QID) | INTRAMUSCULAR | Status: DC | PRN

## 2021-01-01 MED ORDER — METRONIDAZOLE IN NACL 5-0.79 MG/ML-% IV SOLN
500.0000 mg | Freq: Three times a day (TID) | INTRAVENOUS | Status: DC
  Administered 2021-01-01 – 2021-01-03 (×6): 500 mg via INTRAVENOUS
  Filled 2021-01-01 (×6): qty 100

## 2021-01-01 MED ORDER — KETOROLAC TROMETHAMINE 30 MG/ML IJ SOLN
30.0000 mg | Freq: Once | INTRAMUSCULAR | Status: AC
  Administered 2021-01-01: 30 mg via INTRAVENOUS
  Filled 2021-01-01: qty 1

## 2021-01-01 MED ORDER — ACETAMINOPHEN 325 MG PO TABS: 650.0000 mg | ORAL_TABLET | Freq: Four times a day (QID) | ORAL | Status: DC | PRN

## 2021-01-01 MED ORDER — SODIUM CHLORIDE 0.9 % IV SOLN
2.0000 g | INTRAVENOUS | Status: DC
  Administered 2021-01-01 – 2021-01-02 (×2): 2 g via INTRAVENOUS
  Filled 2021-01-01: qty 20
  Filled 2021-01-01: qty 2
  Filled 2021-01-01: qty 20

## 2021-01-01 MED ORDER — ACETAMINOPHEN 650 MG RE SUPP: 650.0000 mg | Freq: Four times a day (QID) | RECTAL | Status: DC | PRN

## 2021-01-01 MED ORDER — ONDANSETRON HCL 4 MG/2ML IJ SOLN: 4.0000 mg | Freq: Four times a day (QID) | INTRAMUSCULAR | Status: DC | PRN

## 2021-01-01 MED ORDER — SODIUM CHLORIDE 0.9 % IV BOLUS
500.0000 mL | Freq: Once | INTRAVENOUS | Status: AC
  Administered 2021-01-01: 500 mL via INTRAVENOUS

## 2021-01-01 MED ORDER — LIP MEDEX EX OINT
TOPICAL_OINTMENT | CUTANEOUS | Status: AC
  Filled 2021-01-01: qty 7

## 2021-01-01 MED ORDER — SODIUM CHLORIDE 0.9 % IV SOLN: INTRAVENOUS | Status: AC

## 2021-01-01 NOTE — H&P (Signed)
[facility] History and Physical  Sandra Mccoy VOZ:[id number] DOB: August 13, 1951 DOA: 01/01/2021   PCP: [name], MD  Specialists: None  Chief Complaint: Abdominal pain with nausea and vomiting ongoing for a week  HPI: Sandra Mccoy is a 70 y.o. female with a past medical history of hypothyroidism, essential hypertension, borderline diabetes, hyperlipidemia who presented to the emergency department with complaints of nausea, vomiting and right-sided abdominal pain.  Started about a week ago.  Denies any fever or chills.  Symptoms got particularly worse on Wednesday with multiple episodes of vomiting.  She finally decided to come to the emergency department this morning.  Currently pain is well controlled after she was given pain medications.  Earlier today it was 10 out of 10 in intensity in the right upper abdomen with no radiation.  Describes as achy pain. No precipitating aggravating or relieving factors.  In the emergency department blood work showed elevated LFTs.  Ultrasound of the abdomen showed cholelithiasis with findings suggestive of cholecystitis.  She was hospitalized for further management.  Home Medications: Prior to Admission medications   Medication Sig Start Date End Date Taking? Authorizing Provider  cholecalciferol (VITAMIN D3) 25 MCG (1000 UNIT) tablet Take 1,000 Units by mouth every morning.   Yes [provider]  clobetasol ointment (TEMOVATE) 0.05 % Apply 1 application topically 2 (two) times daily as needed (eczema).   Yes [provider]  ezetimibe (ZETIA) 10 MG tablet Take 10 mg by mouth every morning.   Yes [provider]  GARLIC PO Take 1 tablet by mouth at bedtime.   Yes [provider]  Ginger, Zingiber officinalis, (GINGER PO) Take 1 tablet by mouth at bedtime.   Yes [provider]  levothyroxine (SYNTHROID) 88 MCG tablet Take 88 mcg by mouth every morning. 10/14/20  Yes [provider]  loratadine  (CLARITIN) 10 MG tablet Take 10 mg by mouth at bedtime as needed for allergies (congestion).   Yes [provider]  Multiple Vitamin (MULTIVITAMIN WITH MINERALS) TABS tablet Take 1 tablet by mouth every morning.   Yes [provider]  olmesartan-hydrochlorothiazide (BENICAR HCT) 20-12.5 MG tablet Take 1 tablet by mouth every morning. 10/14/20  Yes [provider]    Allergies:  Allergies  Allergen Reactions  . Lipitor [Atorvastatin] Nausea And Vomiting and Other (See Comments)    Made her loopy  . Simvastatin Other (See Comments)    Increased LFT's    Past Medical History: Past Medical History:  Diagnosis Date  . Diabetes mellitus without complication (HCC)   . Hyperlipidemia   . Hypertension     History reviewed. No pertinent surgical history.  Social History: Lives with her husband.  No history of smoking alcohol use or illicit drug use.  Usually independent with daily activities.   Family History:  Family History  Problem Relation Age of Onset  . Hypertension Mother   . Hypertension Father      Review of Systems - General ROS: positive for  - fatigue Psychological ROS: negative Ophthalmic ROS: negative ENT ROS: negative Allergy and Immunology ROS: negative Hematological and Lymphatic ROS: negative Endocrine ROS: negative Respiratory ROS: no cough, shortness of breath, or wheezing Cardiovascular ROS: no chest pain or dyspnea on exertion Gastrointestinal ROS: As in HPI Genito-Urinary ROS: no dysuria, trouble voiding, or hematuria Musculoskeletal ROS: negative Neurological ROS: no TIA or stroke symptoms Dermatological ROS: negative  Physical Examination  Vitals:   01/01/21 1300 01/01/21 1330 01/01/21 1400 01/01/21 1540  BP: (!) 177/81 (!) 141/76 (!) 107/49 133/69  Pulse: 74 63 85 79  Resp: 18 20 20 16   Temp:    98.6 F (37 C)  TempSrc:    Oral  SpO2: 100% 97% 99% 99%  Weight:      Height:        BP 133/69 (BP Location: Right  Arm)   Pulse 79   Temp 98.6 F (37 C) (Oral)   Resp 16   Ht 5\' 3"  (1.6 m)   Wt 73 kg   SpO2 99%   BMI 28.52 kg/m   General appearance: alert, cooperative, appears stated age and no distress Head: Normocephalic, without obvious abnormality, atraumatic Eyes: conjunctivae/corneas clear. PERRL, EOM's intact.  Throat: lips, mucosa, and tongue normal; teeth and gums normal Neck: no adenopathy, no carotid bruit, no JVD, supple, symmetrical, trachea midline and thyroid not enlarged, symmetric, no tenderness/mass/nodules Resp: clear to auscultation bilaterally Cardio: regular rate and rhythm, S1, S2 normal, no murmur, click, rub or gallop GI: Abdomen soft.  Mildly tender in the right upper quadrant without any rebound rigidity or guarding.  No masses organomegaly.  Bowel sounds sluggish but present. Extremities: extremities normal, atraumatic, no cyanosis or edema Pulses: 2+ and symmetric Skin: Skin color, texture, turgor normal. No rashes or lesions Lymph nodes: Cervical, supraclavicular, and axillary nodes normal. Neurologic: Alert and oriented x3.  No focal neurological deficits.     Labs on Admission: I have personally reviewed following labs and imaging studies  CBC: Recent Labs  Lab 01/01/21 1012  WBC 10.4  NEUTROABS 7.7  HGB 13.6  HCT 39.1  MCV 90.7  PLT 235   Basic Metabolic Panel: Recent Labs  Lab 01/01/21 1012  NA 134*  K 4.2  CL 96*  CO2 26  GLUCOSE 149*  BUN 16  CREATININE 0.95  CALCIUM 10.9*   GFR: Estimated Creatinine Clearance: 53.5 mL/min (by C-G formula based on SCr of 0.95 mg/dL). Liver Function Tests: Recent Labs  Lab 01/01/21 1012  AST 96*  ALT 184*  ALKPHOS 136*  BILITOT 1.0  PROT 8.5*  ALBUMIN 4.3   Recent Labs  Lab 01/01/21 1012  LIPASE 31   CBG: Recent Labs  Lab 01/01/21 1456  GLUCAP 109*     Radiological Exams on Admission: 03/03/21 Abdomen Limited RUQ (LIVER/GB)  Result Date: 01/01/2021 CLINICAL DATA:  Right upper quadrant  abdominal pain, nausea and vomiting intermittently for 4 days with positive clinical Murphy's sign. EXAM: ULTRASOUND ABDOMEN LIMITED RIGHT UPPER QUADRANT COMPARISON:  None. FINDINGS: Gallbladder: Numerous calcified shadowing gallstones layering in the gallbladder, largest 1.2 cm. Mild gallbladder wall thickening. Sonographic [place] sign is present. Trace pericholecystic fluid. Common bile duct: Diameter: 5 mm Liver: No focal lesion identified. Within normal limits in parenchymal echogenicity. Portal vein is patent on color Doppler imaging with normal direction of blood flow towards the liver. Other: None. IMPRESSION: 1. Cholelithiasis. Mild gallbladder wall thickening. Sonographic Murphy sign present. Trace pericholecystic fluid. Findings are compatible with acute calculous cholecystitis. 2. No biliary ductal dilatation.  CBD diameter 5 mm. 3. Normal liver. Electronically Signed   By: 03/03/2021 M.D.   On: 01/01/2021 11:54      Problem List  Principal Problem:   Acute cholecystitis Active Problems:   Essential hypertension   Hyperlipidemia   Cholecystitis   Hyponatremia   Hypercalcemia   Hypothyroidism   Assessment: This is a 70 year old African-American female with past medical history as stated earlier who comes in with 1 week history of  upper abdominal discomfort which got acutely worse a few days ago with nausea and vomiting.  She has abnormal LFTs.  Imaging studies raises concern for acute cholecystitis.  Plan: 1. Acute cholecystitis with cholelithiasis: General surgery has been consulted by ED provider.  Have notified Dr. Carolynne Edouard about the arrival of the patient to the hospital.  Defer management to general surgery at this time.  Pain control.  Antiemetics as needed.  Will initiate ceftriaxone and metronidazole.  2.  Essential hypertension: Blood pressure was initially quite elevated when she presented likely due to pain.  We will hold her antihypertensives for now.  Use hydralazine as  needed.  3.  Hyperlipidemia: Hold her statin for now.  4.  History of hypothyroidism: Resume Synthroid when patient is able to take orally.  5.  Hyponatremia and hypercalcemia: Most likely due to hypovolemia.  We will give her IV fluids and recheck labs tomorrow.  6. History of borderline diabetes.  Tells me that her recent HbA1c was 6.4.  Check glucose levels daily for now.   DVT Prophylaxis: SCDs for now.  Chemical prophylaxis after surgery when cleared to do so by general surgery Code Status: Full code Family Communication: Discussed with the patient Disposition: Hopefully return home when improved Consults called: Surgeon Admission Status: Status is: Inpatient  Remains inpatient appropriate because:Ongoing active pain requiring inpatient pain management, IV treatments appropriate due to intensity of illness or inability to take PO and Inpatient level of care appropriate due to severity of illness   Dispo: The patient is from: Home              Anticipated d/c is to: Home              Patient currently is not medically stable to d/c.   Difficult to place patient No     Severity of Illness: The appropriate patient status for this patient is INPATIENT. Inpatient status is judged to be reasonable and necessary in order to provide the required intensity of service to ensure the patient's safety. The patient's presenting symptoms, physical exam findings, and initial radiographic and laboratory data in the context of their chronic comorbidities is felt to place them at high risk for further clinical deterioration. Furthermore, it is not anticipated that the patient will be medically stable for discharge from the hospital within 2 midnights of admission. The following factors support the patient status of inpatient.   " The patient's presenting symptoms include pain with nausea and vomiting. " The worrisome physical exam findings include abdominal tenderness. " The initial radiographic  and laboratory data are worrisome because of abnormal LFTs concerning for cholecystitis. " The chronic co-morbidities include hypertension, hypothyroidism.   * I certify that at the point of admission it is my clinical judgment that the patient will require inpatient hospital care spanning beyond 2 midnights from the point of admission due to high intensity of service, high risk for further deterioration and high frequency of surveillance required.*    Further management decisions will depend on results of further testing and patient's response to treatment.   Sandra Mccoy [employer]  [facility] [employer] on [email or web address]  01/01/2021, 5:17 PM

## 2021-01-01 NOTE — ED Provider Notes (Signed)
[facility] EMERGENCY DEPARTMENT Provider Note   CSN: [id number] Arrival date & time: 01/01/21  4098     History Chief Complaint  Patient presents with  . Abdominal Pain    Sandra Mccoy is a 70 y.o. female.  HPI   70 year old female presents the emergency department with right upper quadrant/flank pain associated with nausea/vomiting.  Patient states her symptoms started about a week ago, lasted a couple days, then seemed to improve however yesterday the symptoms became more severe.  She had a decreased appetite, unable to eat or drink anything last night and this morning.  States that the pain is worse, right up underneath her right ribs.  She is still having bowel movements, the stool is soft she denies any diarrhea.  Denies any genitourinary symptoms, no history of kidney stones.  No known history of AAA.  Past Medical History:  Diagnosis Date  . Diabetes mellitus without complication (HCC)   . Hyperlipidemia   . Hypertension     Patient Active Problem List   Diagnosis Date Noted  . Upper respiratory tract infection 07/13/2017  . Sore throat 07/13/2017  . Cough 07/13/2017  . Essential hypertension 07/13/2017  . Hyperlipidemia 07/13/2017  . Type 2 diabetes mellitus without complication, without long-term current use of insulin (HCC) 07/13/2017    History reviewed. No pertinent surgical history.   OB History   No obstetric history on file.     History reviewed. No pertinent family history.  Social History   Tobacco Use  . Smoking status: Never Smoker  . Smokeless tobacco: Never Used  Substance Use Topics  . Alcohol use: No  . Drug use: No    Home Medications Prior to Admission medications   Medication Sig Start Date End Date Taking? Authorizing Provider  Atorvastatin Calcium (LIPITOR PO) Take by mouth daily.    [provider]  ezetimibe (ZETIA) 10 MG tablet Take 10 mg by mouth daily.    [provider]  losartan (COZAAR) 50 MG  tablet Take 50 mg by mouth daily.    [provider]  promethazine-codeine (PHENERGAN WITH CODEINE) 6.25-10 MG/5ML syrup Take 5 mLs by mouth at bedtime as needed for cough. 07/13/17   Georgina Quint, MD    Allergies    Lipitor [atorvastatin]  Review of Systems   Review of Systems  Constitutional: Positive for appetite change. Negative for chills and fever.  HENT: Negative for congestion.   Eyes: Negative for visual disturbance.  Respiratory: Negative for shortness of breath.   Cardiovascular: Negative for chest pain.  Gastrointestinal: Positive for abdominal pain, nausea and vomiting. Negative for diarrhea.  Genitourinary: Negative for dysuria.  Skin: Negative for rash.  Neurological: Negative for headaches.    Physical Exam Updated Vital Signs BP (!) 150/113 (BP Location: Right Arm)   Pulse 81   Temp 98.8 F (37.1 C) (Oral)   Resp 20   Ht 5\' 3"  (1.6 m)   Wt 73 kg   SpO2 100%   BMI 28.52 kg/m   Physical Exam Vitals and nursing note reviewed.  Constitutional:      Appearance: Normal appearance.  HENT:     Head: Normocephalic.     Mouth/Throat:     Mouth: Mucous membranes are moist.  Cardiovascular:     Rate and Rhythm: Normal rate.  Pulmonary:     Effort: Pulmonary effort is normal. No respiratory distress.  Abdominal:     Palpations: Abdomen is soft. There is no pulsatile mass.  Tenderness: There is abdominal tenderness in the right upper quadrant and epigastric area. Positive signs include Murphy's sign.     Hernia: No hernia is present.  Skin:    General: Skin is warm.  Neurological:     Mental Status: She is alert and oriented to person, place, and time. Mental status is at baseline.  Psychiatric:        Mood and Affect: Mood normal.     ED Results / Procedures / Treatments   Labs (all labs ordered are listed, but only abnormal results are displayed) Labs Reviewed  CBC WITH DIFFERENTIAL/PLATELET  LIPASE, BLOOD  COMPREHENSIVE  METABOLIC PANEL  URINALYSIS, ROUTINE W REFLEX MICROSCOPIC    EKG None  Radiology No results found.  Procedures Procedures   Medications Ordered in ED Medications - No data to display  ED Course  I have reviewed the triage vital signs and the nursing notes.  Pertinent labs & imaging results that were available during my care of the patient were reviewed by me and considered in my medical decision making (see chart for details).    MDM Rules/Calculators/A&P                          70 year old female presents emergency department right upper quadrant domino pain associated with nausea/vomiting.  Blood work shows no leukocytosis but a mild transaminitis, bilirubin is normal.  Ultrasound shows gallstones with gallbladder thickening and pericholecystic fluid.  Talked to general surgery, Dr. Carolynne Edouard about cholecystitis.  He recommends medical admission and will evaluate the patient.  Patients evaluation and results requires admission for further treatment and care. Patient agrees with admission plan, offers no new complaints and is stable/unchanged at time of admit.  Dr. Ronaldo Miyamoto is excepting  Final Clinical Impression(s) / ED Diagnoses Final diagnoses:  RUQ abdominal pain    Rx / DC Orders ED Discharge Orders    None       Rozelle Logan, DO 01/01/21 1433

## 2021-01-01 NOTE — ED Notes (Signed)
Patient transported to CT 

## 2021-01-01 NOTE — ED Triage Notes (Signed)
Pt arrives pov with c/o RUQ pain with radiation to R flank, decreased appetite. Pt endorses N/V, denies diarrhea, dysuria.

## 2021-01-01 NOTE — ED Notes (Signed)
Attempted iv x2 (right ac and right forearm) unsuccessful.

## 2021-01-01 NOTE — Progress Notes (Signed)
Notified by EDP of need for admission d/t acute cholecystitis. [facility] accepts patient to med-surg bed at [facility]. EDP is to remain responsible for orders/medical decisions while patient is holding at [facility] Upon arrival to [facility], [facility] will assume care. Nursing/Floor staff will call flow manager/carelink to notify them of patient's arrival so that the proper [facility] member may be contacted. Nursing staff will also notify General Surgery (Dr. Carolynne Edouard) that patient has arrived and is ready for their evaluation. Thank you.

## 2021-01-01 NOTE — Consult Note (Signed)
Reason for Consult:abd pain Referring Physician: Dr. Reece AgarGokul  Sandra Mccoy is an 70 y.o. female.  HPI: The patient is a 70 year old black female who presents with at least 5 days if not more of right upper quadrant pain.  The pain is been associated with nausea and vomiting.  She denies any fevers or chills.  She went to the emergency department where an ultrasound showed gallstones and a thickened gallbladder wall consistent with cholecystitis with cholelithiasis.  [facility].  On arrival here she seems to be pain-free and not nauseated.  She does have some elevated liver functions.  Past Medical History:  Diagnosis Date  . Diabetes mellitus without complication (HCC)   . Hyperlipidemia   . Hypertension     History reviewed. No pertinent surgical history.  Family History  Problem Relation Age of Onset  . Hypertension Mother   . Hypertension Father     Social History:  reports that she has never smoked. She has never used smokeless tobacco. She reports that she does not drink alcohol and does not use drugs.  Allergies:  Allergies  Allergen Reactions  . Lipitor [Atorvastatin] Nausea And Vomiting and Other (See Comments)    Made her loopy  . Simvastatin Other (See Comments)    Increased LFT's    Medications: I have reviewed the patient's current medications.  Results for orders placed or performed during the hospital encounter of 01/01/21 (from the past 48 hour(s))  CBC with Differential     Status: None   Collection Time: 01/01/21 10:12 AM  Result Value Ref Range   WBC 10.4 4.0 - 10.5 K/uL   RBC 4.31 3.87 - 5.11 MIL/uL   Hemoglobin 13.6 12.0 - 15.0 g/dL   HCT 16.139.1 09.636.0 - 04.546.0 %   MCV 90.7 80.0 - 100.0 fL   MCH 31.6 26.0 - 34.0 pg   MCHC 34.8 30.0 - 36.0 g/dL   RDW 40.912.9 81.111.5 - 91.415.5 %   Platelets 235 150 - 400 K/uL   nRBC 0.0 0.0 - 0.2 %   Neutrophils Relative % 74 %   Neutro Abs 7.7 1.7 - 7.7 K/uL   Lymphocytes Relative 21 %    Lymphs Abs 2.2 0.7 - 4.0 K/uL   Monocytes Relative 5 %   Monocytes Absolute 0.5 0.1 - 1.0 K/uL   Eosinophils Relative 0 %   Eosinophils Absolute 0.0 0.0 - 0.5 K/uL   Basophils Relative 0 %   Basophils Absolute 0.0 0.0 - 0.1 K/uL   Immature Granulocytes 0 %   Abs Immature Granulocytes 0.03 0.00 - 0.07 K/uL    Comment: Performed at [facility] [place], [address] [place], [place] [address]  Lipase, blood     Status: None   Collection Time: 01/01/21 10:12 AM  Result Value Ref Range   Lipase 31 11 - 51 U/L    Comment: Performed at [facility], [address] [place], [place] [address]  Comprehensive metabolic panel     Status: Abnormal   Collection Time: 01/01/21 10:12 AM  Result Value Ref Range   Sodium 134 (L) 135 - 145 mmol/L   Potassium 4.2 3.5 - 5.1 mmol/L   Chloride 96 (L) 98 - 111 mmol/L   CO2 26 22 - 32 mmol/L   Glucose, Bld 149 (H) 70 - 99 mg/dL    Comment: Glucose reference range applies only to samples taken after fasting for at least 8  hours.   BUN 16 8 - 23 mg/dL   Creatinine, Ser 3.97 0.44 - 1.00 mg/dL   Calcium 67.3 (H) 8.9 - 10.3 mg/dL   Total Protein 8.5 (H) 6.5 - 8.1 g/dL   Albumin 4.3 3.5 - 5.0 g/dL   AST 96 (H) 15 - 41 U/L   ALT 184 (H) 0 - 44 U/L   Alkaline Phosphatase 136 (H) 38 - 126 U/L   Total Bilirubin 1.0 0.3 - 1.2 mg/dL   GFR, Estimated >41 >93 mL/min    Comment: (NOTE) Calculated using the CKD-EPI Creatinine Equation (2021)    Anion gap 12 5 - 15    Comment: Performed at [facility], [address] [place], [place] [address]  Urinalysis, Routine w reflex microscopic Urine, Clean Catch     Status: Abnormal   Collection Time: 01/01/21 10:40 AM  Result Value Ref Range   Color, Urine YELLOW YELLOW   APPearance HAZY (A) CLEAR   Specific Gravity, Urine 1.025 1.005 - 1.030   pH 7.0 5.0 - 8.0   Glucose, UA NEGATIVE NEGATIVE mg/dL   Hgb urine dipstick NEGATIVE NEGATIVE   Bilirubin Urine SMALL (A) NEGATIVE    Ketones, ur >80 (A) NEGATIVE mg/dL   Protein, ur NEGATIVE NEGATIVE mg/dL   Nitrite NEGATIVE NEGATIVE   Leukocytes,Ua SMALL (A) NEGATIVE    Comment: Performed at [facility], [address] [place], [place] [address]  Urinalysis, Microscopic (reflex)     Status: Abnormal   Collection Time: 01/01/21 10:40 AM  Result Value Ref Range   RBC / HPF 0-5 0 - 5 RBC/hpf   WBC, UA 21-50 0 - 5 WBC/hpf   Bacteria, UA FEW (A) NONE SEEN   Squamous Epithelial / LPF 0-5 0 - 5   Non Squamous Epithelial PRESENT (A) NONE SEEN    Comment: Performed at [facility], [address] [place], [place] [address]  Resp Panel by RT-PCR (Flu A&B, Covid) Nasopharyngeal Swab     Status: None   Collection Time: 01/01/21  1:00 PM   Specimen: Nasopharyngeal Swab; Nasopharyngeal(NP) swabs in vial transport medium  Result Value Ref Range   SARS Coronavirus 2 by RT PCR NEGATIVE NEGATIVE    Comment: (NOTE) SARS-CoV-2 target nucleic acids are NOT DETECTED.  The SARS-CoV-2 RNA is generally detectable in upper respiratory specimens during the acute phase of infection. The lowest concentration of SARS-CoV-2 viral copies this assay can detect is 138 copies/mL. A negative result does not preclude SARS-Cov-2 infection and should not be used as the sole basis for treatment or other patient management decisions. A negative result may occur with  improper specimen collection/handling, submission of specimen other than nasopharyngeal swab, presence of viral mutation(s) within the areas targeted by this assay, and inadequate number of viral copies(<138 copies/mL). A negative result must be combined with clinical observations, patient history, and epidemiological information. The expected result is Negative.  Fact Sheet for Patients:  BloggerCourse.com  Fact Sheet for Healthcare Providers:  SeriousBroker.it  This test is no t yet approved or cleared by  the [place] FDA and  has been authorized for detection and/or diagnosis of SARS-CoV-2 by FDA under an Emergency Use Authorization (EUA). This EUA will remain  in effect (meaning this test can be used) for the duration of the COVID-19 declaration under Section 564(b)(1) of the Act, 21 U.S.C.section 360bbb-3(b)(1), unless the authorization is terminated  or revoked sooner.  Influenza A by PCR NEGATIVE NEGATIVE   Influenza B by PCR NEGATIVE NEGATIVE    Comment: (NOTE) The Xpert Xpress SARS-CoV-2/FLU/RSV plus assay is intended as an aid in the diagnosis of influenza from Nasopharyngeal swab specimens and should not be used as a sole basis for treatment. Nasal washings and aspirates are unacceptable for Xpert Xpress SARS-CoV-2/FLU/RSV testing.  Fact Sheet for Patients: BloggerCourse.com  Fact Sheet for Healthcare Providers: SeriousBroker.it  This test is not yet approved or cleared by the [place] FDA and has been authorized for detection and/or diagnosis of SARS-CoV-2 by FDA under an Emergency Use Authorization (EUA). This EUA will remain in effect (meaning this test can be used) for the duration of the COVID-19 declaration under Section 564(b)(1) of the Act, 21 U.S.C. section 360bbb-3(b)(1), unless the authorization is terminated or revoked.  Performed at [facility], [address] [place], [place] [address]   CBG monitoring, ED     Status: Abnormal   Collection Time: 01/01/21  2:56 PM  Result Value Ref Range   Glucose-Capillary 109 (H) 70 - 99 mg/dL    Comment: Glucose reference range applies only to samples taken after fasting for at least 8 hours.    US Abdomen Limited RUQ (LIVER/GB)  Result Date: 01/01/2021 CLINICAL DATA:  Right upper quadrant abdominal pain, nausea and vomiting intermittently for 4 days with positive clinical Murphy's sign. EXAM: ULTRASOUND ABDOMEN LIMITED RIGHT UPPER QUADRANT  COMPARISON:  None. FINDINGS: Gallbladder: Numerous calcified shadowing gallstones layering in the gallbladder, largest 1.2 cm. Mild gallbladder wall thickening. Sonographic Eulah Pont sign is present. Trace pericholecystic fluid. Common bile duct: Diameter: 5 mm Liver: No focal lesion identified. Within normal limits in parenchymal echogenicity. Portal vein is patent on color Doppler imaging with normal direction of blood flow towards the liver. Other: None. IMPRESSION: 1. Cholelithiasis. Mild gallbladder wall thickening. Sonographic Murphy sign present. Trace pericholecystic fluid. Findings are compatible with acute calculous cholecystitis. 2. No biliary ductal dilatation.  CBD diameter 5 mm. 3. Normal liver. Electronically Signed   By: Delbert Phenix M.D.   On: 01/01/2021 11:54    Review of Systems  Constitutional: Negative.   HENT: Negative.   Eyes: Negative.   Respiratory: Negative.   Cardiovascular: Negative.   Gastrointestinal: Positive for abdominal pain, nausea and vomiting.  Endocrine: Negative.   Genitourinary: Negative.   Musculoskeletal: Negative.   Skin: Negative.   Allergic/Immunologic: Negative.   Neurological: Negative.   Hematological: Negative.   Psychiatric/Behavioral: Negative.    Blood pressure 140/77, pulse 84, temperature 98.6 F (37 C), temperature source Oral, resp. rate 18, height 5\' 3"  (1.6 m), weight 73 kg, SpO2 100 %. Physical Exam Constitutional:      General: She is not in acute distress.    Appearance: Normal appearance. She is obese.  HENT:     Head: Normocephalic and atraumatic.     Right Ear: External ear normal.     Left Ear: External ear normal.     Nose: Nose normal.     Mouth/Throat:     Mouth: Mucous membranes are dry.     Pharynx: Oropharynx is clear.  Eyes:     General: No scleral icterus.    Extraocular Movements: Extraocular movements intact.     Conjunctiva/sclera: Conjunctivae normal.     Pupils: Pupils are equal, round, and reactive to  light.  Cardiovascular:     Rate and Rhythm: Normal rate and regular rhythm.     Pulses: Normal pulses.  Heart sounds: Normal heart sounds.  Pulmonary:     Effort: Pulmonary effort is normal. No respiratory distress.     Breath sounds: Normal breath sounds.  Abdominal:     General: Abdomen is flat.     Palpations: Abdomen is soft.     Tenderness: There is no abdominal tenderness.  Musculoskeletal:        General: No swelling or deformity. Normal range of motion.     Cervical back: Normal range of motion and neck supple. No tenderness.  Skin:    General: Skin is warm and dry.     Coloration: Skin is not jaundiced.  Neurological:     General: No focal deficit present.     Mental Status: She is alert and oriented to person, place, and time.  Psychiatric:        Mood and Affect: Mood normal.        Behavior: Behavior normal.     Assessment/Plan: The patient appears to have cholecystitis with cholelithiasis.  She has made significant improvement since transfer.  At this point I would agree with treating her with broad-spectrum antibiotic therapy and rechecking her liver function tests in the morning.  The options for management would be antibiotic therapy alone versus antibiotics and percutaneous drainage since that this has been going on for several days versus surgical removal of the gallbladder.  We will discuss this with the medical team and decide on a course of action as we finish our evaluation.  Sandra Mccoy 01/01/2021, 7:03 PM

## 2021-01-01 NOTE — ED Notes (Signed)
Report called to [facility]. Attempted report to floor nurse at [facility], to return my call.

## 2021-01-01 NOTE — ED Notes (Signed)
Report called to Diona Browner ,RN at [facility]. Melodye Ped

## 2021-01-01 NOTE — ED Notes (Signed)
States she feels much better post IV fluid bolus

## 2021-01-02 ENCOUNTER — Encounter (HOSPITAL_COMMUNITY): Payer: Self-pay | Admitting: Internal Medicine

## 2021-01-02 ENCOUNTER — Ambulatory Visit (HOSPITAL_COMMUNITY): Payer: Self-pay

## 2021-01-02 DIAGNOSIS — K819 Cholecystitis, unspecified: Secondary | ICD-10-CM | POA: Diagnosis not present

## 2021-01-02 DIAGNOSIS — I10 Essential (primary) hypertension: Secondary | ICD-10-CM

## 2021-01-02 DIAGNOSIS — E785 Hyperlipidemia, unspecified: Secondary | ICD-10-CM

## 2021-01-02 DIAGNOSIS — K810 Acute cholecystitis: Secondary | ICD-10-CM | POA: Diagnosis not present

## 2021-01-02 DIAGNOSIS — K8020 Calculus of gallbladder without cholecystitis without obstruction: Secondary | ICD-10-CM

## 2021-01-02 LAB — COMPREHENSIVE METABOLIC PANEL
ALT: 109 U/L — ABNORMAL HIGH (ref 0–44)
AST: 43 U/L — ABNORMAL HIGH (ref 15–41)
Albumin: 3.5 g/dL (ref 3.5–5.0)
Alkaline Phosphatase: 108 U/L (ref 38–126)
Anion gap: 10 (ref 5–15)
BUN: 23 mg/dL (ref 8–23)
CO2: 24 mmol/L (ref 22–32)
Calcium: 9.9 mg/dL (ref 8.9–10.3)
Chloride: 104 mmol/L (ref 98–111)
Creatinine, Ser: 1.05 mg/dL — ABNORMAL HIGH (ref 0.44–1.00)
GFR, Estimated: 58 mL/min — ABNORMAL LOW (ref >60)
Glucose, Bld: 114 mg/dL — ABNORMAL HIGH (ref 70–99)
Potassium: 3.7 mmol/L (ref 3.5–5.1)
Sodium: 138 mmol/L (ref 135–145)
Total Bilirubin: 1.1 mg/dL (ref 0.3–1.2)
Total Protein: 7.2 g/dL (ref 6.5–8.1)

## 2021-01-02 LAB — CBC
HCT: 35.6 % — ABNORMAL LOW (ref 36.0–46.0)
Hemoglobin: 11.9 g/dL — ABNORMAL LOW (ref 12.0–15.0)
MCH: 31.4 pg (ref 26.0–34.0)
MCHC: 33.4 g/dL (ref 30.0–36.0)
MCV: 93.9 fL (ref 80.0–100.0)
Platelets: 198 10*3/uL (ref 150–400)
RBC: 3.79 MIL/uL — ABNORMAL LOW (ref 3.87–5.11)
RDW: 13.2 % (ref 11.5–15.5)
WBC: 13.8 10*3/uL — ABNORMAL HIGH (ref 4.0–10.5)
nRBC: 0 % (ref 0.0–0.2)

## 2021-01-02 LAB — SURGICAL PCR SCREEN
MRSA, PCR: NEGATIVE
Staphylococcus aureus: NEGATIVE

## 2021-01-02 LAB — PROTIME-INR
INR: 1.1 (ref 0.8–1.2)
Prothrombin Time: 13.5 seconds (ref 11.4–15.2)

## 2021-01-02 LAB — HIV ANTIBODY (ROUTINE TESTING W REFLEX): HIV Screen 4th Generation wRfx: NONREACTIVE

## 2021-01-02 MED ORDER — HYDROCHLOROTHIAZIDE 12.5 MG PO CAPS
12.5000 mg | ORAL_CAPSULE | Freq: Every day | ORAL | Status: DC
  Administered 2021-01-02: 12.5 mg via ORAL
  Filled 2021-01-02: qty 1

## 2021-01-02 MED ORDER — LEVOTHYROXINE SODIUM 88 MCG PO TABS
88.0000 ug | ORAL_TABLET | Freq: Every day | ORAL | Status: DC
  Administered 2021-01-02: 88 ug via ORAL
  Filled 2021-01-02: qty 1

## 2021-01-02 MED ORDER — OLMESARTAN MEDOXOMIL-HCTZ 20-12.5 MG PO TABS: 1.0000 | ORAL_TABLET | Freq: Every morning | ORAL | Status: DC

## 2021-01-02 MED ORDER — IRBESARTAN 150 MG PO TABS
150.0000 mg | ORAL_TABLET | Freq: Every day | ORAL | Status: DC
  Administered 2021-01-02: 150 mg via ORAL
  Filled 2021-01-02: qty 1

## 2021-01-02 NOTE — Progress Notes (Signed)
   Subjective/Chief Complaint: No complaints. Denies pain   Objective: Vital signs in last 24 hours: Temp:  [98.4 F (36.9 C)-99.6 F (37.6 C)] 98.4 F (36.9 C) (04/03 0930) Pulse Rate:  [63-99] 87 (04/03 0930) Resp:  [16-20] 16 (04/03 0930) BP: (107-177)/(49-108) 137/68 (04/03 0930) SpO2:  [95 %-100 %] 95 % (04/03 0930) Last BM Date: 12/31/20  Intake/Output from previous day: 04/02 0701 - 04/03 0700 In: 1181.3 [I.V.:881.3; IV Piggyback:300] Out: 200 [Urine:200] Intake/Output this shift: Total I/O In: 0  Out: 200 [Urine:200]  General appearance: alert and cooperative Resp: clear to auscultation bilaterally Cardio: regular rate and rhythm GI: soft, nontender  Lab Results:  Recent Labs    01/01/21 1012 01/02/21 0416  WBC 10.4 13.8*  HGB 13.6 11.9*  HCT 39.1 35.6*  PLT 235 198   BMET Recent Labs    01/01/21 1012 01/02/21 0416  NA 134* 138  K 4.2 3.7  CL 96* 104  CO2 26 24  GLUCOSE 149* 114*  BUN 16 23  CREATININE 0.95 1.05*  CALCIUM 10.9* 9.9   PT/INR Recent Labs    01/02/21 0416  LABPROT 13.5  INR 1.1   ABG No results for input(s): PHART, HCO3 in the last 72 hours.  Invalid input(s): PCO2, PO2  Studies/Results: US Abdomen Limited RUQ (LIVER/GB)  Result Date: 01/01/2021 CLINICAL DATA:  Right upper quadrant abdominal pain, nausea and vomiting intermittently for 4 days with positive clinical Murphy's sign. EXAM: ULTRASOUND ABDOMEN LIMITED RIGHT UPPER QUADRANT COMPARISON:  None. FINDINGS: Gallbladder: Numerous calcified shadowing gallstones layering in the gallbladder, largest 1.2 cm. Mild gallbladder wall thickening. Sonographic Eulah Pont sign is present. Trace pericholecystic fluid. Common bile duct: Diameter: 5 mm Liver: No focal lesion identified. Within normal limits in parenchymal echogenicity. Portal vein is patent on color Doppler imaging with normal direction of blood flow towards the liver. Other: None. IMPRESSION: 1. Cholelithiasis. Mild  gallbladder wall thickening. Sonographic Murphy sign present. Trace pericholecystic fluid. Findings are compatible with acute calculous cholecystitis. 2. No biliary ductal dilatation.  CBD diameter 5 mm. 3. Normal liver. Electronically Signed   By: Delbert Phenix M.D.   On: 01/01/2021 11:54    Anti-infectives: Anti-infectives (From admission, onward)   Start     Dose/Rate Route Frequency Ordered Stop   01/01/21 1800  cefTRIAXone (ROCEPHIN) 2 g in sodium chloride 0.9 % 100 mL IVPB        2 g 200 mL/hr over 30 Minutes Intravenous Every 24 hours 01/01/21 1716     01/01/21 1800  metroNIDAZOLE (FLAGYL) IVPB 500 mg        500 mg 100 mL/hr over 60 Minutes Intravenous Every 8 hours 01/01/21 1716        Assessment/Plan: s/p * No surgery found * Advance diet. Allow clears lft's improving Continue abx for cholecystitis Would probably benefit from surgery during this admission. Will discuss timing with team  LOS: 1 day    Sandra Mccoy 01/02/2021

## 2021-01-02 NOTE — Progress Notes (Signed)
PROGRESS NOTE  Sandra Mccoy:[id number] DOB: Mar 04, 1951 DOA: 01/01/2021 PCP: [name], MD  Brief History   70 year old woman PMH including hypothyroidism, borderline diabetes, presented with nausea vomiting and right-sided abdominal pain.  Admitted for acute cholecystitis. A & P  Acute cholecystitis with cholelithiasis. --Asymptomatic at this time.  Laboratory studies reassuring.  --Continue empiric antibiotics.  Follow-up general surgery recommendations. --Chales Abrahams Periop Risk 0.3%, low risk.  Can do more than 4 METS.  No cardiac history, reported chest pain or shortness of breath.  Patient is cleared for surgery, no further evaluation suggested.  Hypothyroidism --Stable.  Continue levothyroxine  Essential hypertension --Stable.  Continue Benicar hydrochlorothiazide  Borderline diabetes --Recent hemoglobin A1c reportedly 6.4.  Follow daily blood sugars.  Hyperlipidemia --Stable.  Continue Zetia on discharge.  Hypercalcemia has resolved with fluids.  Hyponatremia has resolved with fluids.  Disposition Plan:  Discussion:   Status is: Inpatient  Remains inpatient appropriate because:IV treatments appropriate due to intensity of illness or inability to take PO and Inpatient level of care appropriate due to severity of illness   Dispo: The patient is from: Home              Anticipated d/c is to: Home              Patient currently is not medically stable to d/c.   Difficult to place patient No  DVT prophylaxis: SCDs Start: 01/01/21 1717   Code Status: Full Code Level of care: Med-Surg Family Communication: husband and daughter at bedside  Brendia Sacks, MD  [facility] Direct contact: see [email or web address] (further directions at bottom of note if needed) 7PM-7AM contact night coverage as at bottom of note 01/02/2021, 10:15 AM  LOS: 1 day   Significant Hospital Events   . 4/2 admit for acute cholecystitis   Consults:  . General surgery    Procedures:   .   Significant Diagnostic Tests:  . 4/2 RUQ u/s cholelithiasis, sonographic Murphy sign, compatible with acute cholecystitis.  No biliary ductal dilatation.  Normal liver.   Micro Data:  . None   Antimicrobials:  . Ceftriaxone 4/2 > . Metronidazole 4/2   Interval History/Subjective  CC: f/u abd pain  Feels good today, no nausea or vomiting, no abdominal pain.  No shortness of breath or chest pain.  Able to go up a flight of stairs and walk on level ground without difficulty.  No cardiac history.  Objective   Vitals:  Vitals:   01/02/21 0507 01/02/21 0930  BP: 133/69 137/68  Pulse: 96 87  Resp: 17 16  Temp: 98.9 F (37.2 C) 98.4 F (36.9 C)  SpO2: 96% 95%    Exam:  Constitutional:   . Appears calm and comfortable ENMT:  . grossly normal hearing  Respiratory:  . CTA bilaterally, no w/r/r.  . Respiratory effort normal.  Cardiovascular:  . RRR, no m/r/g . No LE extremity edema   Abdomen:  . Abdomen appears normal; no tenderness or masses Psychiatric:  . Mental status o Mood, affect appropriate  I have personally reviewed the following:   Today's Data  . CBG stable . BMP unremarkable . AST down to 43, ALT down to 109.  Total bilirubin and alkaline phosphatase within normal limits. . Modest leukocytosis 13.8.  Scheduled Meds: Continuous Infusions: . sodium chloride 75 mL/hr at 01/02/21 0800  . cefTRIAXone (ROCEPHIN)  IV 2 g (01/01/21 1831)  . metronidazole 500 mg (01/02/21 0433)    Principal Problem:   Acute cholecystitis Active  Problems:   Essential hypertension   Hyperlipidemia   Type 2 diabetes mellitus without complication, without long-term current use of insulin (HCC)   Hypothyroidism   Cholelithiasis   LOS: 1 day   How to contact the [facility] Attending or Consulting provider 7A - 7P or covering provider during after hours 7P -7A, for this patient?  1. Check the care team in [facility] and look for a) attending/consulting [facility] provider listed and b)  the [facility] team listed 2. Log into [email or web address] and use [facility]'s universal password to access. If you do not have the password, please contact the hospital operator. 3. Locate the [facility] provider you are looking for under [facility] and page to a number that you can be directly reached. 4. If you still have difficulty reaching the provider, please page the [facility] (Director on Call) for the Hospitalists listed on amion for assistance.

## 2021-01-02 NOTE — Hospital Course (Addendum)
70 year old woman PMH including hypothyroidism, borderline diabetes, presented with nausea vomiting and right-sided abdominal pain.  Admitted for acute cholecystitis. A & P  Acute cholecystitis with cholelithiasis. --Remains asymptomatic.  Continue antibiotics.  Discussed with Dr. Gerrit Friends.  Plan for cholecystectomy today.  Hypothyroidism --remains stable.  Continue levothyroxine  Essential hypertension --remains stable.  Continue Benicar-hydrochlorothiazide  Borderline diabetes --Recent hemoglobin A1c reportedly 6.4.  Follow daily blood sugars. Stable.   Hyperlipidemia --Stable.  Continue Zetia on discharge.

## 2021-01-03 ENCOUNTER — Encounter (HOSPITAL_COMMUNITY): Payer: Self-pay | Admitting: Internal Medicine

## 2021-01-03 ENCOUNTER — Inpatient Hospital Stay (HOSPITAL_COMMUNITY): Payer: Medicare Other | Admitting: Certified Registered Nurse Anesthetist

## 2021-01-03 ENCOUNTER — Encounter (HOSPITAL_COMMUNITY)
Admission: EM | Disposition: A | Discharge status: Home / Self Care | Payer: Self-pay | Source: Home / Self Care | Attending: Family Medicine

## 2021-01-03 ENCOUNTER — Inpatient Hospital Stay (HOSPITAL_COMMUNITY): Payer: Medicare Other

## 2021-01-03 DIAGNOSIS — I10 Essential (primary) hypertension: Secondary | ICD-10-CM | POA: Diagnosis not present

## 2021-01-03 DIAGNOSIS — E785 Hyperlipidemia, unspecified: Secondary | ICD-10-CM | POA: Diagnosis not present

## 2021-01-03 DIAGNOSIS — E119 Type 2 diabetes mellitus without complications: Secondary | ICD-10-CM | POA: Diagnosis not present

## 2021-01-03 DIAGNOSIS — K810 Acute cholecystitis: Secondary | ICD-10-CM | POA: Diagnosis not present

## 2021-01-03 HISTORY — PX: CHOLECYSTECTOMY: SHX55

## 2021-01-03 LAB — COMPREHENSIVE METABOLIC PANEL
ALT: 72 U/L — ABNORMAL HIGH (ref 0–44)
AST: 25 U/L (ref 15–41)
Albumin: 3.2 g/dL — ABNORMAL LOW (ref 3.5–5.0)
Alkaline Phosphatase: 88 U/L (ref 38–126)
Anion gap: 10 (ref 5–15)
BUN: 16 mg/dL (ref 8–23)
CO2: 23 mmol/L (ref 22–32)
Calcium: 9.7 mg/dL (ref 8.9–10.3)
Chloride: 103 mmol/L (ref 98–111)
Creatinine, Ser: 0.88 mg/dL (ref 0.44–1.00)
GFR, Estimated: >60 mL/min (ref >60)
Glucose, Bld: 98 mg/dL (ref 70–99)
Potassium: 3.5 mmol/L (ref 3.5–5.1)
Sodium: 136 mmol/L (ref 135–145)
Total Bilirubin: 1.1 mg/dL (ref 0.3–1.2)
Total Protein: 7 g/dL (ref 6.5–8.1)

## 2021-01-03 LAB — CBC
HCT: 34 % — ABNORMAL LOW (ref 36.0–46.0)
Hemoglobin: 11.4 g/dL — ABNORMAL LOW (ref 12.0–15.0)
MCH: 31.1 pg (ref 26.0–34.0)
MCHC: 33.5 g/dL (ref 30.0–36.0)
MCV: 92.6 fL (ref 80.0–100.0)
Platelets: 182 10*3/uL (ref 150–400)
RBC: 3.67 MIL/uL — ABNORMAL LOW (ref 3.87–5.11)
RDW: 13.2 % (ref 11.5–15.5)
WBC: 11.8 10*3/uL — ABNORMAL HIGH (ref 4.0–10.5)
nRBC: 0 % (ref 0.0–0.2)

## 2021-01-03 LAB — GLUCOSE, CAPILLARY
Glucose-Capillary: 94 mg/dL (ref 70–99)
Glucose-Capillary: 123 mg/dL — ABNORMAL HIGH (ref 70–99)

## 2021-01-03 SURGERY — LAPAROSCOPIC CHOLECYSTECTOMY WITH INTRAOPERATIVE CHOLANGIOGRAM
Anesthesia: General | Site: Abdomen

## 2021-01-03 MED ORDER — LACTATED RINGERS IV SOLN: INTRAVENOUS | Status: DC

## 2021-01-03 MED ORDER — FENTANYL CITRATE (PF) 100 MCG/2ML IJ SOLN
INTRAMUSCULAR | Status: AC
  Filled 2021-01-03: qty 2

## 2021-01-03 MED ORDER — OXYCODONE HCL 5 MG PO TABS: 5.0000 mg | ORAL_TABLET | ORAL | Status: DC | PRN

## 2021-01-03 MED ORDER — PROPOFOL 10 MG/ML IV BOLUS
INTRAVENOUS | Status: DC | PRN
  Administered 2021-01-03: 140 mg via INTRAVENOUS

## 2021-01-03 MED ORDER — ACETAMINOPHEN 10 MG/ML IV SOLN
INTRAVENOUS | Status: DC | PRN
  Administered 2021-01-03: 1000 mg via INTRAVENOUS

## 2021-01-03 MED ORDER — CHLORHEXIDINE GLUCONATE CLOTH 2 % EX PADS: 6.0000 | MEDICATED_PAD | Freq: Once | CUTANEOUS | Status: DC

## 2021-01-03 MED ORDER — CHLORHEXIDINE GLUCONATE 0.12 % MT SOLN
15.0000 mL | OROMUCOSAL | Status: AC
  Administered 2021-01-03: 15 mL via OROMUCOSAL

## 2021-01-03 MED ORDER — SODIUM CHLORIDE 0.45 % IV SOLN: INTRAVENOUS | Status: DC

## 2021-01-03 MED ORDER — ONDANSETRON HCL 4 MG/2ML IJ SOLN: 4.0000 mg | Freq: Four times a day (QID) | INTRAMUSCULAR | Status: DC | PRN

## 2021-01-03 MED ORDER — LIDOCAINE 2% (20 MG/ML) 5 ML SYRINGE
INTRAMUSCULAR | Status: DC | PRN
  Administered 2021-01-03: 100 mg via INTRAVENOUS

## 2021-01-03 MED ORDER — MIDAZOLAM HCL 2 MG/2ML IJ SOLN
INTRAMUSCULAR | Status: AC
  Filled 2021-01-03: qty 2

## 2021-01-03 MED ORDER — ONDANSETRON HCL 4 MG/2ML IJ SOLN
INTRAMUSCULAR | Status: AC
  Filled 2021-01-03: qty 2

## 2021-01-03 MED ORDER — ACETAMINOPHEN 325 MG PO TABS: 650.0000 mg | ORAL_TABLET | Freq: Four times a day (QID) | ORAL | Status: DC | PRN

## 2021-01-03 MED ORDER — ACETAMINOPHEN 650 MG RE SUPP: 650.0000 mg | Freq: Four times a day (QID) | RECTAL | Status: DC | PRN

## 2021-01-03 MED ORDER — DEXAMETHASONE SODIUM PHOSPHATE 10 MG/ML IJ SOLN
INTRAMUSCULAR | Status: AC
  Filled 2021-01-03: qty 1

## 2021-01-03 MED ORDER — FENTANYL CITRATE (PF) 100 MCG/2ML IJ SOLN
INTRAMUSCULAR | Status: AC
  Administered 2021-01-03: 50 ug via INTRAVENOUS
  Filled 2021-01-03: qty 2

## 2021-01-03 MED ORDER — DEXAMETHASONE SODIUM PHOSPHATE 10 MG/ML IJ SOLN
INTRAMUSCULAR | Status: DC | PRN
  Administered 2021-01-03: 10 mg via INTRAVENOUS

## 2021-01-03 MED ORDER — ONDANSETRON HCL 4 MG/2ML IJ SOLN
INTRAMUSCULAR | Status: DC | PRN
  Administered 2021-01-03: 4 mg via INTRAVENOUS

## 2021-01-03 MED ORDER — TRAMADOL HCL 50 MG PO TABS
50.0000 mg | ORAL_TABLET | Freq: Four times a day (QID) | ORAL | Status: DC | PRN
  Administered 2021-01-03: 50 mg via ORAL
  Filled 2021-01-03: qty 1

## 2021-01-03 MED ORDER — SUGAMMADEX SODIUM 200 MG/2ML IV SOLN
INTRAVENOUS | Status: DC | PRN
  Administered 2021-01-03: 200 mg via INTRAVENOUS

## 2021-01-03 MED ORDER — FENTANYL CITRATE (PF) 100 MCG/2ML IJ SOLN
INTRAMUSCULAR | Status: DC | PRN
  Administered 2021-01-03 (×2): 50 ug via INTRAVENOUS
  Administered 2021-01-03: 100 ug via INTRAVENOUS

## 2021-01-03 MED ORDER — FENTANYL CITRATE (PF) 100 MCG/2ML IJ SOLN: 25.0000 ug | INTRAMUSCULAR | Status: DC | PRN

## 2021-01-03 MED ORDER — ROCURONIUM BROMIDE 10 MG/ML (PF) SYRINGE
PREFILLED_SYRINGE | INTRAVENOUS | Status: DC | PRN
  Administered 2021-01-03: 60 mg via INTRAVENOUS

## 2021-01-03 MED ORDER — HYDROMORPHONE HCL 1 MG/ML IJ SOLN: 1.0000 mg | INTRAMUSCULAR | Status: DC | PRN

## 2021-01-03 MED ORDER — PROPOFOL 10 MG/ML IV BOLUS
INTRAVENOUS | Status: AC
  Filled 2021-01-03: qty 20

## 2021-01-03 MED ORDER — BUPIVACAINE-EPINEPHRINE 0.5% -1:200000 IJ SOLN
INTRAMUSCULAR | Status: DC | PRN
  Administered 2021-01-03: 20 mL

## 2021-01-03 MED ORDER — LIDOCAINE 2% (20 MG/ML) 5 ML SYRINGE
INTRAMUSCULAR | Status: AC
  Filled 2021-01-03: qty 5

## 2021-01-03 MED ORDER — ONDANSETRON 4 MG PO TBDP: 4.0000 mg | ORAL_TABLET | Freq: Four times a day (QID) | ORAL | Status: DC | PRN

## 2021-01-03 MED ORDER — ACETAMINOPHEN 10 MG/ML IV SOLN
INTRAVENOUS | Status: AC
  Filled 2021-01-03: qty 100

## 2021-01-03 MED ORDER — ONDANSETRON HCL 4 MG/2ML IJ SOLN: 4.0000 mg | Freq: Once | INTRAMUSCULAR | Status: DC | PRN

## 2021-01-03 MED ORDER — BUPIVACAINE-EPINEPHRINE (PF) 0.5% -1:200000 IJ SOLN
INTRAMUSCULAR | Status: AC
  Filled 2021-01-03: qty 30

## 2021-01-03 MED ORDER — MIDAZOLAM HCL 5 MG/5ML IJ SOLN
INTRAMUSCULAR | Status: DC | PRN
  Administered 2021-01-03: 2 mg via INTRAVENOUS

## 2021-01-03 SURGICAL SUPPLY — 41 items
ADH SKN CLS APL DERMABOND .7 (GAUZE/BANDAGES/DRESSINGS) ×1
APL PRP STRL LF DISP 70% ISPRP (MISCELLANEOUS) ×1
APPLIER CLIP ROT 10 11.4 M/L (STAPLE) ×3
APR CLP MED LRG 11.4X10 (STAPLE) ×1
BAG SPEC RTRVL LRG 6X4 10 (ENDOMECHANICALS) ×1
CABLE HIGH FREQUENCY MONO STRZ (ELECTRODE) ×3 IMPLANT
CHLORAPREP W/TINT 26 (MISCELLANEOUS) ×4 IMPLANT
CLIP APPLIE ROT 10 11.4 M/L (STAPLE) ×1 IMPLANT
CLOSURE WOUND 1/2 X4 (GAUZE/BANDAGES/DRESSINGS)
COVER MAYO STAND STRL (DRAPES) ×1 IMPLANT
COVER SURGICAL LIGHT HANDLE (MISCELLANEOUS) ×3 IMPLANT
COVER WAND RF STERILE (DRAPES) IMPLANT
DECANTER SPIKE VIAL GLASS SM (MISCELLANEOUS) ×1 IMPLANT
DERMABOND ADVANCED (GAUZE/BANDAGES/DRESSINGS) ×2
DERMABOND ADVANCED .7 DNX12 (GAUZE/BANDAGES/DRESSINGS) IMPLANT
DRAPE C-ARM 42X120 X-RAY (DRAPES) ×3 IMPLANT
ELECT PENCIL ROCKER SW 15FT (MISCELLANEOUS) ×2 IMPLANT
ELECT REM PT RETURN 15FT ADLT (MISCELLANEOUS) ×3 IMPLANT
GAUZE SPONGE 2X2 8PLY STRL LF (GAUZE/BANDAGES/DRESSINGS) ×1 IMPLANT
GLOVE SURG ORTHO LTX SZ8 (GLOVE) ×3 IMPLANT
GOWN STRL REUS W/TWL XL LVL3 (GOWN DISPOSABLE) ×6 IMPLANT
HEMOSTAT SURGICEL 4X8 (HEMOSTASIS) IMPLANT
KIT BASIN OR (CUSTOM PROCEDURE TRAY) ×3 IMPLANT
KIT TURNOVER KIT A (KITS) ×3 IMPLANT
PENCIL SMOKE EVACUATOR (MISCELLANEOUS) IMPLANT
POUCH SPECIMEN RETRIEVAL 10MM (ENDOMECHANICALS) ×3 IMPLANT
SCISSORS LAP 5X35 DISP (ENDOMECHANICALS) ×3 IMPLANT
SET CHOLANGIOGRAPH MIX (MISCELLANEOUS) ×3 IMPLANT
SET IRRIG TUBING LAPAROSCOPIC (IRRIGATION / IRRIGATOR) ×3 IMPLANT
SET TUBE SMOKE EVAC HIGH FLOW (TUBING) ×2 IMPLANT
SLEEVE XCEL OPT CAN 5 100 (ENDOMECHANICALS) ×3 IMPLANT
SPONGE GAUZE 2X2 STER 10/PKG (GAUZE/BANDAGES/DRESSINGS)
STRIP CLOSURE SKIN 1/2X4 (GAUZE/BANDAGES/DRESSINGS) IMPLANT
SUT MNCRL AB 4-0 PS2 18 (SUTURE) ×3 IMPLANT
SUT NOVA NAB GS-21 0 18 T12 DT (SUTURE) ×2 IMPLANT
TOWEL OR 17X26 10 PK STRL BLUE (TOWEL DISPOSABLE) ×3 IMPLANT
TOWEL OR NON WOVEN STRL DISP B (DISPOSABLE) ×1 IMPLANT
TRAY LAPAROSCOPIC (CUSTOM PROCEDURE TRAY) ×3 IMPLANT
TROCAR BLADELESS OPT 5 100 (ENDOMECHANICALS) ×3 IMPLANT
TROCAR XCEL BLUNT TIP 100MML (ENDOMECHANICALS) ×3 IMPLANT
TROCAR XCEL NON-BLD 11X100MML (ENDOMECHANICALS) ×3 IMPLANT

## 2021-01-03 NOTE — Transfer of Care (Signed)
Immediate Anesthesia Transfer of Care Note  Patient: Sandra Mccoy  Procedure(s) Performed: Procedure(s): LAPAROSCOPIC CHOLECYSTECTOMY WITH INTRAOPERATIVE CHOLANGIOGRAM AND UMBLICIAL HERNIA REPAIR (N/A)  Patient Location: PACU  Anesthesia Type:General  Level of Consciousness: Alert, Awake, Oriented  Airway & Oxygen Therapy: Patient Spontanous Breathing  Post-op Assessment: Report given to RN  Post vital signs: Reviewed and stable  Last Vitals:  Vitals:   01/02/21 2027 01/03/21 0628  BP: 139/70 136/63  Pulse: 85 79  Resp: 18 18  Temp: 36.8 C 36.7 C  SpO2: 98% 97%    Complications: No apparent anesthesia complications

## 2021-01-03 NOTE — Anesthesia Preprocedure Evaluation (Addendum)
Anesthesia Evaluation  Patient identified by MRN, date of birth, ID band Patient awake    Reviewed: Allergy & Precautions, NPO status , Patient's Chart, lab work & pertinent test results  Airway Mallampati: II  TM Distance: >3 FB Neck ROM: Full    Dental  (+) Teeth Intact, Dental Advisory Given   Pulmonary neg pulmonary ROS,    Pulmonary exam normal breath sounds clear to auscultation       Cardiovascular hypertension, Pt. on medications Normal cardiovascular exam Rhythm:Regular Rate:Normal     Neuro/Psych negative neurological ROS  negative psych ROS   GI/Hepatic negative GI ROS, Acute Cholecystitis   Endo/Other  diabetes, Type 2Hypothyroidism   Renal/GU negative Renal ROS     Musculoskeletal negative musculoskeletal ROS (+)   Abdominal   Peds  Hematology  (+) Blood dyscrasia, anemia ,   Anesthesia Other Findings Day of surgery medications reviewed with the patient.  Reproductive/Obstetrics                            Anesthesia Physical Anesthesia Plan  ASA: II  Anesthesia Plan: General   Post-op Pain Management:    Induction: Intravenous  PONV Risk Score and Plan: 4 or greater and Midazolam, Dexamethasone, Ondansetron and Treatment may vary due to age or medical condition  Airway Management Planned: Oral ETT  Additional Equipment:   Intra-op Plan:   Post-operative Plan: Extubation in OR  Informed Consent: I have reviewed the patients History and Physical, chart, labs and discussed the procedure including the risks, benefits and alternatives for the proposed anesthesia with the patient or authorized representative who has indicated his/her understanding and acceptance.     Dental advisory given  Plan Discussed with: CRNA  Anesthesia Plan Comments:         Anesthesia Quick Evaluation

## 2021-01-03 NOTE — Anesthesia Postprocedure Evaluation (Signed)
Anesthesia Post Note  Patient: KELLI ROBECK  Procedure(s) Performed: LAPAROSCOPIC CHOLECYSTECTOMY WITH INTRAOPERATIVE CHOLANGIOGRAM AND UMBLICIAL HERNIA REPAIR (N/A Abdomen)     Patient location during evaluation: PACU Anesthesia Type: General Level of consciousness: awake and alert, oriented and awake Pain management: pain level controlled Vital Signs Assessment: post-procedure vital signs reviewed and stable Respiratory status: spontaneous breathing, nonlabored ventilation, respiratory function stable and patient connected to nasal cannula oxygen Cardiovascular status: blood pressure returned to baseline and stable Postop Assessment: no apparent nausea or vomiting Anesthetic complications: no   No complications documented.  Last Vitals:  Vitals:   01/03/21 1230 01/03/21 1245  BP: (!) 147/80 (!) 154/78  Pulse: 81 77  Resp: 14 12  Temp:    SpO2: 95% 97%    Last Pain:  Vitals:   01/03/21 1245  TempSrc:   PainSc: 5                  Cecile Hearing

## 2021-01-03 NOTE — Discharge Instructions (Signed)
[facility] ° °Please arrive at least 30 min before your appointment to complete your check in paperwork.  If you are unable to arrive 30 min prior to your appointment time we may have to cancel or reschedule you. °LAPAROSCOPIC SURGERY: POST OP INSTRUCTIONS °Always review your discharge instruction sheet given to you by the facility where your surgery was performed. °IF YOU HAVE DISABILITY OR FAMILY LEAVE FORMS, YOU MUST BRING THEM TO THE OFFICE FOR PROCESSING.   °DO NOT GIVE THEM TO YOUR DOCTOR. ° °PAIN CONTROL ° °1. First take acetaminophen (Tylenol) AND/or ibuprofen (Advil) to control your pain after surgery.  Follow directions on package.  Taking acetaminophen (Tylenol) and/or ibuprofen (Advil) regularly after surgery will help to control your pain and lower the amount of prescription pain medication you may need.  You should not take more than 4,000 mg (4 grams) of acetaminophen (Tylenol) in 24 hours.  You should not take ibuprofen (Advil), aleve, motrin, naprosyn or other NSAIDS if you have a history of stomach ulcers or chronic kidney disease.  °2. A prescription for pain medication may be given to you upon discharge.  Take your pain medication as prescribed, if you still have uncontrolled pain after taking acetaminophen (Tylenol) or ibuprofen (Advil). °3. Use ice packs to help control pain. °4. If you need a refill on your pain medication, please contact your pharmacy.  They will contact our office to request authorization. Prescriptions will not be filled after 5pm or on week-ends. ° °HOME MEDICATIONS °5. Take your usually prescribed medications unless otherwise directed. ° °DIET °6. You should follow a light diet the first few days after arrival home.  Be sure to include lots of fluids daily. Avoid fatty, fried foods.  ° °CONSTIPATION °7. It is common to experience some constipation after surgery and if you are taking pain medication.  Increasing fluid intake and taking a stool  softener (such as Colace) will usually help or prevent this problem from occurring.  A mild laxative (Milk of Magnesia or Miralax) should be taken according to package instructions if there are no bowel movements after 48 hours. ° °WOUND/INCISION CARE °8. Most patients will experience some swelling and bruising in the area of the incisions.  Ice packs will help.  Swelling and bruising can take several days to resolve.  °9. Unless discharge instructions indicate otherwise, follow guidelines below  °a. STERI-STRIPS - you may remove your outer bandages 48 hours after surgery, and you may shower at that time.  You have steri-strips (small skin tapes) in place directly over the incision.  These strips should be left on the skin for 7-10 days.   °b. DERMABOND/SKIN GLUE - you may shower in 24 hours.  The glue will flake off over the next 2-3 weeks. °10. Any sutures or staples will be removed at the office during your follow-up visit. ° °ACTIVITIES °11. You may resume regular (light) daily activities beginning the next day--such as daily self-care, walking, climbing stairs--gradually increasing activities as tolerated.  You may have sexual intercourse when it is comfortable.  Refrain from any heavy lifting or straining until approved by your doctor. °a. You may drive when you are no longer taking prescription pain medication, you can comfortably wear a seatbelt, and you can safely maneuver your car and apply brakes. ° °FOLLOW-UP °12. You should see your doctor in the office for a follow-up appointment approximately 2-3 weeks after your surgery.  You should have been given your post-op/follow-up appointment when   your surgery was scheduled.  If you did not receive a post-op/follow-up appointment, make sure that you call for this appointment within a day or two after you arrive home to insure a convenient appointment time. ° °OTHER INSTRUCTIONS ° °WHEN TO CALL YOUR DOCTOR: °1. Fever over 101.0 °2. Inability to  urinate °3. Continued bleeding from incision. °4. Increased pain, redness, or drainage from the incision. °5. Increasing abdominal pain ° °The clinic staff is available to answer your questions during regular business hours.  Please don’t hesitate to call and ask to speak to one of the nurses for clinical concerns.  If you have a medical emergency, go to the nearest emergency room or call 911.  A surgeon from [facility] is always on call at the hospital. °[address], [address], [place], [place]  [address] ? [address] [address], [place], [place]   [address] °[phone] ? [phone] ? FAX [phone] ° ° ° °

## 2021-01-03 NOTE — Progress Notes (Signed)
PROGRESS NOTE  [name] MGQ:[id number] DOB: May 16, 1951 DOA: 01/01/2021 PCP: [name], MD  Brief History   70 year old woman PMH including hypothyroidism, borderline diabetes, presented with nausea vomiting and right-sided abdominal pain.  Admitted for acute cholecystitis. A & P  Acute cholecystitis with cholelithiasis. --Remains asymptomatic.  Continue antibiotics.  Discussed with Dr. Gerrit Friends.  Plan for cholecystectomy today.  Hypothyroidism --remains stable.  Continue levothyroxine  Essential hypertension --remains stable.  Continue Benicar-hydrochlorothiazide  Borderline diabetes --Recent hemoglobin A1c reportedly 6.4.  Follow daily blood sugars. Stable.   Hyperlipidemia --Stable.  Continue Zetia on discharge.  Disposition Plan:  Discussion: Plan for cholecystectomy today.  Discussed with Dr. Gerrit Friends.  May be able to go home tomorrow if doing well.  Status is: Inpatient  Remains inpatient appropriate because:IV treatments appropriate due to intensity of illness or inability to take PO and Inpatient level of care appropriate due to severity of illness   Dispo: The patient is from: Home              Anticipated d/c is to: Home              Patient currently is not medically stable to d/c.   Difficult to place patient No  DVT prophylaxis: SCDs Start: 01/01/21 1717   Code Status: Full Code Level of care: Med-Surg Family Communication: husband and daughter at bedside  Brendia Sacks, MD  [facility] Direct contact: see [email or web address] (further directions at bottom of note if needed) 7PM-7AM contact night coverage as at bottom of note 01/03/2021, 11:47 AM  LOS: 2 days   Significant Hospital Events   . 4/2 admit for acute cholecystitis   Consults:  . General surgery    Procedures:  .   Significant Diagnostic Tests:  . 4/2 RUQ u/s cholelithiasis, sonographic Murphy sign, compatible with acute cholecystitis.  No biliary ductal dilatation.  Normal liver.   Micro  Data:  . None   Antimicrobials:  . Ceftriaxone 4/2 > . Metronidazole 4/2   Interval History/Subjective  CC: f/u abd pain  Feels good, no pain.  No complaints.  Objective   Vitals:  Vitals:   01/02/21 2027 01/03/21 0628  BP: 139/70 136/63  Pulse: 85 79  Resp: 18 18  Temp: 98.2 F (36.8 C) 98.1 F (36.7 C)  SpO2: 98% 97%    Exam:  Constitutional:   . Appears calm and comfortable ENMT:  . grossly normal hearing  Respiratory:  . CTA bilaterally, no w/r/r.  . Respiratory effort normal.  Cardiovascular:  . RRR, no m/r/g Psychiatric:  . Mental status o Mood, affect appropriate  I have personally reviewed the following:   Today's Data  . Complete metabolic panel notable for mild elevation of ALT which is trending down. . WBC trending down 11.8.  Hemoglobin stable 11.4.  Platelets stable 182.  Scheduled Meds: . [MAR Hold] irbesartan  150 mg Oral Daily   And  . [MAR Hold] hydrochlorothiazide  12.5 mg Oral Daily  . [MAR Hold] levothyroxine  88 mcg Oral Q0600   Continuous Infusions: . [MAR Hold] cefTRIAXone (ROCEPHIN)  IV 2 g (01/02/21 1715)  . lactated ringers 10 mL/hr at 01/03/21 0904  . [MAR Hold] metronidazole 500 mg (01/03/21 0534)    Principal Problem:   Acute cholecystitis Active Problems:   Essential hypertension   Hyperlipidemia   Type 2 diabetes mellitus without complication, without long-term current use of insulin (HCC)   Hypothyroidism   Cholelithiasis   LOS: 2 days   How  to contact the [facility] Attending or Consulting provider 7A - 7P or covering provider during after hours 7P -7A, for this patient?  1. Check the care team in [facility] and look for a) attending/consulting [facility] provider listed and b) the [facility] team listed 2. Log into [email or web address] and use [place]'s universal password to access. If you do not have the password, please contact the hospital operator. 3. Locate the [facility] provider you are looking for under [facility] and page to a  number that you can be directly reached. 4. If you still have difficulty reaching the provider, please page the [facility] (Director on Call) for the Hospitalists listed on amion for assistance.

## 2021-01-03 NOTE — Op Note (Addendum)
Procedure Note  Pre-operative Diagnosis:  Acute cholecystitis, cholelithiasis, abnormal liver enzymes  Post-operative Diagnosis:  Same; umbilical hernia  Surgeon:  Darnell Level, MD  Assistant:  Luretha Murphy, MD; Myrtie Soman, RNFA   Procedure:  Laparoscopic cholecystectomy with intra-operative cholangiography; primary repair of umbilical hernia  Anesthesia:  General  Estimated Blood Loss:  minimal  Drains: none         Specimen: gallbladder to pathology  Indications:  Patient is a 70 yo female admitted to the medical service with acute cholecystitis and cholelithiasis for cholecystectomy.  Procedure Details:  The patient was seen in the pre-op holding area. The risks, benefits, complications, treatment options, and expected outcomes were previously discussed with the patient. The patient agreed with the proposed plan and has signed the informed consent form.  The patient was transported to operating room #4 at the [facility]. The patient was placed in the supine position on the operating room table. Following induction of general anesthesia, the abdomen was prepped and draped in the usual aseptic fashion.  An incision was made in the skin in the midline above the umbilicus. The subcutaneous tissue was separated and a hernia sac was entered.  Omentum was reduced and the peritoneal cavity was entered and a Hasson cannula was introduced under direct vision. The cannula was secured with a 0-Vicryl pursestring suture. Pneumoperitoneum was established with carbon dioxide. Additional cannulae were introduced under direct vision along the right costal margin in the midline, mid-clavicular line, and anterior axillary line.   The gallbladder was identified markedly edematous and inflamed and thick-walled.  It was aspirated and the fundus grasped and retracted cephalad. Adhesions were taken down bluntly and the electrocautery was utilized as needed, taking care not to involve any  adjacent structures. The infundibulum was grasped and retracted laterally, exposing the markedly thickened peritoneum overlying the triangle of Calot. The peritoneum was incised and structures exposed with blunt dissection. The cystic duct was clearly identified, bluntly dissected circumferentially.  An incision was made in the cystic duct and the cholangiogram catheter introduced. The catheter was secured using an ligaclip.  Real-time cholangiography was performed using C-arm fluoroscopy.  There was rapid filling of a normal caliber common bile duct.  There was reflux of contrast into the left and right hepatic ductal systems.  There was free flow distally into the duodenum without filling defect or obstruction.  The catheter was removed from the peritoneal cavity.  The cystic duct was then ligated with ligaclips and divided. The cystic artery was identified, dissected circumferentially, ligated with ligaclips, and divided.  The gallbladder was dissected away from the gallbladder bed using the electrocautery for hemostasis. The gallbladder was completely removed from the liver and placed into an endocatch bag. The gallbladder was removed in the endocatch bag through the umbilical port site and submitted to pathology for review.  The right upper quadrant was irrigated and the gallbladder bed was inspected. Hemostasis was achieved with the electrocautery.  Cannulae were removed under direct vision and good hemostasis was noted. Pneumoperitoneum was released and the majority of the carbon dioxide evacuated. The umbilical wound was irrigated and the fascial defect was then closed with interrupted 0-Novofil sutures.  Local anesthetic was infiltrated at all port sites. Skin incisions were closed with 4-0 Monocril subcuticular sutures and Dermabond was applied.  Instrument, sponge, and needle counts were correct at the conclusion of the case.  The patient was awakened from anesthesia and brought to the  recovery room in stable condition.  The patient tolerated the procedure well.   Darnell Level, MD [facility] Office: [phone]

## 2021-01-03 NOTE — Anesthesia Procedure Notes (Signed)
Procedure Name: Intubation Date/Time: 01/03/2021 10:25 AM Performed by: Gerald Leitz, CRNA Pre-anesthesia Checklist: Patient identified, Patient being monitored, Timeout performed, Emergency Drugs available and Suction available Patient Re-evaluated:Patient Re-evaluated prior to induction Oxygen Delivery Method: Circle system utilized Preoxygenation: Pre-oxygenation with 100% oxygen Induction Type: IV induction Ventilation: Mask ventilation without difficulty Laryngoscope Size: Mac and 3 Grade View: Grade I Tube type: Oral Tube size: 7.0 mm Number of attempts: 1 Airway Equipment and Method: Stylet Placement Confirmation: ETT inserted through vocal cords under direct vision,  positive ETCO2 and breath sounds checked- equal and bilateral Secured at: 21 cm Tube secured with: Tape Dental Injury: Teeth and Oropharynx as per pre-operative assessment  Comments: EMT student placed ETT x 1 attempt with MDA Gifford Shave and CRNA Matisha Termine at bedside to assist/teach/guide. Quality placement and technique noted.

## 2021-01-03 NOTE — Progress Notes (Signed)
Assessment & Plan: HD#3 - acute cholecystitis, cholelithiasis  Much improved, no pain this AM  WBC 11K  NPO, IV hydration  Discussed clinical course with patient.  Discussed plans for cholecystectomy.  Plan lap chole with IOC this morning.  Discussed cholangiography, possible need for conversion to open surgery.  Discussed post op recovery.  Answered all questions from family.  The risks and benefits of the procedure have been discussed at length with the patient.  The patient understands the proposed procedure, potential alternative treatments, and the course of recovery to be expected.  All of the patient's questions have been answered at this time.  The patient wishes to proceed with surgery.  Darnell Level, MD [facility] Office: [phone]   Chief Complaint: Acute cholecystitis, cholelithiasis  Subjective: Patient comfortable this AM, NPO.  Objective: Vital signs in last 24 hours: Temp:  [98.1 F (36.7 C)-98.6 F (37 C)] 98.1 F (36.7 C) (04/04 0628) Pulse Rate:  [79-97] 79 (04/04 0628) Resp:  [16-18] 18 (04/04 0628) BP: (136-139)/(63-70) 136/63 (04/04 0628) SpO2:  [95 %-99 %] 97 % (04/04 0628) Last BM Date: 12/31/20  Intake/Output from previous day: 04/03 0701 - 04/04 0700 In: 2847.7 [P.O.:1680; I.V.:825; IV Piggyback:342.7] Out: 1500 [Urine:1500] Intake/Output this shift: No intake/output data recorded.  Physical Exam: HEENT - sclerae clear, mucous membranes moist Neck - soft Chest - clear bilaterally Cor - RRR Abdomen - soft without distension; non-tender; no mass Ext - no edema, non-tender Neuro - alert & oriented, no focal deficits  Lab Results:  Recent Labs    01/02/21 0416 01/03/21 0459  WBC 13.8* 11.8*  HGB 11.9* 11.4*  HCT 35.6* 34.0*  PLT 198 182   BMET Recent Labs    01/02/21 0416 01/03/21 0459  NA 138 136  K 3.7 3.5  CL 104 103  CO2 24 23  GLUCOSE 114* 98  BUN 23 16  CREATININE 1.05* 0.88  CALCIUM 9.9  9.7   PT/INR Recent Labs    01/02/21 0416  LABPROT 13.5  INR 1.1   Comprehensive Metabolic Panel:    Component Value Date/Time   NA 136 01/03/2021 0459   NA 138 01/02/2021 0416   K 3.5 01/03/2021 0459   K 3.7 01/02/2021 0416   CL 103 01/03/2021 0459   CL 104 01/02/2021 0416   CO2 23 01/03/2021 0459   CO2 24 01/02/2021 0416   BUN 16 01/03/2021 0459   BUN 23 01/02/2021 0416   CREATININE 0.88 01/03/2021 0459   CREATININE 1.05 (H) 01/02/2021 0416   GLUCOSE 98 01/03/2021 0459   GLUCOSE 114 (H) 01/02/2021 0416   CALCIUM 9.7 01/03/2021 0459   CALCIUM 9.9 01/02/2021 0416   AST 25 01/03/2021 0459   AST 43 (H) 01/02/2021 0416   ALT 72 (H) 01/03/2021 0459   ALT 109 (H) 01/02/2021 0416   ALKPHOS 88 01/03/2021 0459   ALKPHOS 108 01/02/2021 0416   BILITOT 1.1 01/03/2021 0459   BILITOT 1.1 01/02/2021 0416   PROT 7.0 01/03/2021 0459   PROT 7.2 01/02/2021 0416   ALBUMIN 3.2 (L) 01/03/2021 0459   ALBUMIN 3.5 01/02/2021 0416    Studies/Results: US Abdomen Limited RUQ (LIVER/GB)  Result Date: 01/01/2021 CLINICAL DATA:  Right upper quadrant abdominal pain, nausea and vomiting intermittently for 4 days with positive clinical Murphy's sign. EXAM: ULTRASOUND ABDOMEN LIMITED RIGHT UPPER QUADRANT COMPARISON:  None. FINDINGS: Gallbladder: Numerous calcified shadowing gallstones layering in the gallbladder, largest 1.2 cm. Mild gallbladder wall thickening. Sonographic  Eulah Pont sign is present. Trace pericholecystic fluid. Common bile duct: Diameter: 5 mm Liver: No focal lesion identified. Within normal limits in parenchymal echogenicity. Portal vein is patent on color Doppler imaging with normal direction of blood flow towards the liver. Other: None. IMPRESSION: 1. Cholelithiasis. Mild gallbladder wall thickening. Sonographic Murphy sign present. Trace pericholecystic fluid. Findings are compatible with acute calculous cholecystitis. 2. No biliary ductal dilatation.  CBD diameter 5 mm. 3. Normal liver.  Electronically Signed   By: Delbert Phenix M.D.   On: 01/01/2021 11:54      Darnell Level 01/03/2021  Patient ID: Sandra Mccoy, female   DOB: [birth year], 70 y.o.   MRN: [id number]

## 2021-01-04 ENCOUNTER — Encounter (HOSPITAL_COMMUNITY): Payer: Self-pay | Admitting: Surgery

## 2021-01-04 LAB — GLUCOSE, CAPILLARY: Glucose-Capillary: 131 mg/dL — ABNORMAL HIGH (ref 70–99)

## 2021-01-04 LAB — SURGICAL PATHOLOGY

## 2021-01-04 MED ORDER — POLYETHYLENE GLYCOL 3350 17 G PO PACK: 17.0000 g | PACK | Freq: Every day | ORAL | Status: DC | PRN

## 2021-01-04 MED ORDER — ACETAMINOPHEN 325 MG PO TABS: 650.0000 mg | ORAL_TABLET | Freq: Four times a day (QID) | ORAL | Status: DC | PRN

## 2021-01-04 MED ORDER — HYDROCHLOROTHIAZIDE 12.5 MG PO CAPS
12.5000 mg | ORAL_CAPSULE | Freq: Every day | ORAL | Status: DC
  Administered 2021-01-04: 12.5 mg via ORAL
  Filled 2021-01-04: qty 1

## 2021-01-04 MED ORDER — EZETIMIBE 10 MG PO TABS
10.0000 mg | ORAL_TABLET | Freq: Every morning | ORAL | Status: DC
  Administered 2021-01-04: 10 mg via ORAL
  Filled 2021-01-04: qty 1

## 2021-01-04 MED ORDER — MENTHOL 3 MG MT LOZG
1.0000 | LOZENGE | OROMUCOSAL | Status: DC | PRN
  Filled 2021-01-04: qty 9

## 2021-01-04 MED ORDER — TRAMADOL HCL 50 MG PO TABS: 50.0000 mg | ORAL_TABLET | Freq: Four times a day (QID) | ORAL | 0 refills | Status: DC | PRN

## 2021-01-04 MED ORDER — POLYETHYLENE GLYCOL 3350 17 G PO PACK: 17.0000 g | PACK | Freq: Every day | ORAL | 0 refills | Status: DC | PRN

## 2021-01-04 MED ORDER — OLMESARTAN MEDOXOMIL-HCTZ 20-12.5 MG PO TABS: 1.0000 | ORAL_TABLET | Freq: Every morning | ORAL | Status: DC

## 2021-01-04 MED ORDER — IRBESARTAN 150 MG PO TABS
150.0000 mg | ORAL_TABLET | Freq: Every day | ORAL | Status: DC
  Administered 2021-01-04: 150 mg via ORAL
  Filled 2021-01-04: qty 1

## 2021-01-04 MED ORDER — LEVOTHYROXINE SODIUM 88 MCG PO TABS
88.0000 ug | ORAL_TABLET | Freq: Every day | ORAL | Status: DC
  Administered 2021-01-04: 88 ug via ORAL
  Filled 2021-01-04: qty 1

## 2021-01-04 NOTE — Progress Notes (Signed)
No charge note.  Discussed w/ general surgery, pt looks good, they have offered to take over care and discharge home from their service.  Medically stable. Agree w/ transfer to general surgery and d/c home.  Brendia Sacks, MD [facility]

## 2021-01-04 NOTE — Discharge Summary (Signed)
[facility] Discharge Summary   Patient ID: Sandra Mccoy MRN: [id number] DOB/AGE: March 11, 1951 70 y.o.  Admit date: 01/01/2021 Discharge date: 01/04/2021  Admitting Diagnosis: Acute cholecystitis   Discharge Diagnosis Patient Active Problem List   Diagnosis Date Noted  . Cholelithiasis 01/02/2021  . Acute cholecystitis 01/01/2021  . Hypothyroidism 01/01/2021  . Upper respiratory tract infection 07/13/2017  . Sore throat 07/13/2017  . Cough 07/13/2017  . Essential hypertension 07/13/2017  . Hyperlipidemia 07/13/2017  . Type 2 diabetes mellitus without complication, without long-term current use of insulin (HCC) 07/13/2017    Consultants Hospitalist   Imaging: DG Cholangiogram Operative  Result Date: 01/03/2021 CLINICAL DATA:  70 year old female with history of cholelithiasis. EXAM: INTRAOPERATIVE CHOLANGIOGRAM TECHNIQUE: Cholangiographic images from the C-arm fluoroscopic device were submitted for interpretation post-operatively. Please see the procedural report for the amount of contrast and the fluoroscopy time utilized. COMPARISON:  01/01/2021 FINDINGS: Intraoperative antegrade injection via the cystic duct which opacifies the common bile duct and central portions of the intrahepatic biliary tree. Contrast is visualized flowing freely into the duodenum. There are no filling defects. No significant intra or extrahepatic biliary ductal dilation. No apparent anomalous anatomical configuration of the biliary tree. IMPRESSION: No evidence of choledocholithiasis. Marliss Coots, MD [facility] Radiology Electronically Signed   By: Marliss Coots MD   On: 01/03/2021 11:23    Procedures Dr. Gerrit Friends (01/03/2021) - Laparoscopic Cholecystectomy with [facility]  Hospital Course:  Sandra Mccoy is a 70yo female PMH HTN, HLD, hypothyroidism, borderline DM who presented to [facility] 4/2 with 5 days of RUQ abdominal pain.  Workup showed calculous  cholecystitis.  Patient was admitted and underwent procedure listed above.  Tolerated procedure well and was transferred to the floor.  Diet was advanced as tolerated.  On POD1, the patient was voiding well, tolerating diet, ambulating well, pain well controlled, vital signs stable, incisions c/d/i and felt stable for discharge home.  Patient will follow up as below and knows to call with questions or concerns.    I have personally reviewed the patients medication history on the [place] controlled substance database.    Physical Exam: General:  Alert, NAD, pleasant, comfortable Pulm: rate and effort normal Abd:  Soft, ND, appropriately tender, multiple lap incisions C/D/I, +BS  Allergies as of 01/04/2021      Reactions   Lipitor [atorvastatin] Nausea And Vomiting, Other (See Comments)   Made her loopy   Simvastatin Other (See Comments)   Increased LFT's      Medication List    TAKE these medications   acetaminophen 325 MG tablet Commonly known as: TYLENOL Take 2 tablets (650 mg total) by mouth every 6 (six) hours as needed for mild pain (or Fever >/= 101).   cholecalciferol 25 MCG (1000 UNIT) tablet Commonly known as: VITAMIN D3 Take 1,000 Units by mouth every morning.   ezetimibe 10 MG tablet Commonly known as: ZETIA Take 10 mg by mouth every morning.   GARLIC PO Take 1 tablet by mouth at bedtime.   GINGER PO Take 1 tablet by mouth at bedtime.   levothyroxine 88 MCG tablet Commonly known as: SYNTHROID Take 88 mcg by mouth every morning.   loratadine 10 MG tablet Commonly known as: CLARITIN Take 10 mg by mouth at bedtime as needed for allergies (congestion).   multivitamin with minerals Tabs tablet Take 1 tablet by mouth every morning.   olmesartan-hydrochlorothiazide 20-12.5 MG tablet Commonly known as: BENICAR HCT Take 1 tablet by  mouth every morning.   polyethylene glycol 17 g packet Commonly known as: MIRALAX / GLYCOLAX Take 17 g by mouth daily as needed for mild  constipation.   Temovate 0.05 % Generic drug: clobetasol ointment Apply 1 application topically 2 (two) times daily as needed (eczema).   traMADol 50 MG tablet Commonly known as: ULTRAM Take 1 tablet (50 mg total) by mouth every 6 (six) hours as needed for severe pain.         Follow-up Information    [facility], [place]. Go on 01/25/2021.   Specialty: General Surgery Why: Your appointment is 04/26 at 10am Please arrive 30 minutes prior to your appointment to check in and fill out paperwork. Bring photo ID and insurance information. Contact information: [address] [place] [address] [phone]              Signed: Franne Forts, [facility] 01/04/2021, 8:04 AM Please see Amion for pager number during day hours 7:00am-4:30pm

## 2021-01-04 NOTE — Care Management Important Message (Signed)
Important Message  Patient Details IM Letter given to the Patient. Name: Sandra Mccoy MRN: [id number] Date of Birth: Nov 30, 1950   Medicare Important Message Given:  Yes     Caren Macadam 01/04/2021, 10:50 AM

## 2021-01-04 NOTE — Progress Notes (Signed)
D/C instructions given to patient. Patient had no questions. NT or writer will wheel patient out once her ride is here  

## 2021-01-05 ENCOUNTER — Emergency Department (HOSPITAL_COMMUNITY)
Admission: EM | Admit: 2021-01-05 | Discharge: 2021-01-06 | Disposition: A | Discharge status: Home / Self Care | Payer: Medicare Other | Attending: Emergency Medicine | Admitting: Emergency Medicine

## 2021-01-05 ENCOUNTER — Other Ambulatory Visit: Payer: Self-pay

## 2021-01-05 ENCOUNTER — Encounter (HOSPITAL_COMMUNITY): Payer: Self-pay | Admitting: Emergency Medicine

## 2021-01-05 DIAGNOSIS — R42 Dizziness and giddiness: Secondary | ICD-10-CM | POA: Diagnosis not present

## 2021-01-05 DIAGNOSIS — Z79899 Other long term (current) drug therapy: Secondary | ICD-10-CM | POA: Insufficient documentation

## 2021-01-05 DIAGNOSIS — J189 Pneumonia, unspecified organism: Secondary | ICD-10-CM | POA: Diagnosis not present

## 2021-01-05 DIAGNOSIS — E039 Hypothyroidism, unspecified: Secondary | ICD-10-CM | POA: Insufficient documentation

## 2021-01-05 DIAGNOSIS — E119 Type 2 diabetes mellitus without complications: Secondary | ICD-10-CM | POA: Diagnosis not present

## 2021-01-05 DIAGNOSIS — I10 Essential (primary) hypertension: Secondary | ICD-10-CM | POA: Insufficient documentation

## 2021-01-05 DIAGNOSIS — R55 Syncope and collapse: Secondary | ICD-10-CM | POA: Diagnosis present

## 2021-01-05 NOTE — ED Triage Notes (Signed)
Pt arrived via EMS from home. Pt had gallbladder surgery on Monday, 01/03/2021. This evening pt became weak and had slight AMS. Pt reports poor oral intake for the past few days. Pt has had tarry yellow and black stool. Pt has hx of iron deficiency anemia.

## 2021-01-06 ENCOUNTER — Emergency Department (HOSPITAL_COMMUNITY): Payer: Medicare Other

## 2021-01-06 LAB — CBC
HCT: 37 % (ref 36.0–46.0)
Hemoglobin: 12.4 g/dL (ref 12.0–15.0)
MCH: 31.3 pg (ref 26.0–34.0)
MCHC: 33.5 g/dL (ref 30.0–36.0)
MCV: 93.4 fL (ref 80.0–100.0)
Platelets: 254 10*3/uL (ref 150–400)
RBC: 3.96 MIL/uL (ref 3.87–5.11)
RDW: 13.2 % (ref 11.5–15.5)
WBC: 12.5 10*3/uL — ABNORMAL HIGH (ref 4.0–10.5)
nRBC: 0 % (ref 0.0–0.2)

## 2021-01-06 LAB — COMPREHENSIVE METABOLIC PANEL
ALT: 41 U/L (ref 0–44)
AST: 23 U/L (ref 15–41)
Albumin: 3.4 g/dL — ABNORMAL LOW (ref 3.5–5.0)
Alkaline Phosphatase: 79 U/L (ref 38–126)
Anion gap: 9 (ref 5–15)
BUN: 15 mg/dL (ref 8–23)
CO2: 26 mmol/L (ref 22–32)
Calcium: 10.1 mg/dL (ref 8.9–10.3)
Chloride: 106 mmol/L (ref 98–111)
Creatinine, Ser: 0.93 mg/dL (ref 0.44–1.00)
GFR, Estimated: >60 mL/min (ref >60)
Glucose, Bld: 144 mg/dL — ABNORMAL HIGH (ref 70–99)
Potassium: 3.8 mmol/L (ref 3.5–5.1)
Sodium: 141 mmol/L (ref 135–145)
Total Bilirubin: 0.5 mg/dL (ref 0.3–1.2)
Total Protein: 7.3 g/dL (ref 6.5–8.1)

## 2021-01-06 LAB — POC OCCULT BLOOD, ED: Fecal Occult Bld: NEGATIVE

## 2021-01-06 LAB — LIPASE, BLOOD: Lipase: 31 U/L (ref 11–51)

## 2021-01-06 MED ORDER — SODIUM CHLORIDE 0.9 % IV BOLUS
1000.0000 mL | Freq: Once | INTRAVENOUS | Status: AC
  Administered 2021-01-06: 1000 mL via INTRAVENOUS

## 2021-01-06 MED ORDER — DOXYCYCLINE HYCLATE 100 MG PO CAPS: 100.0000 mg | ORAL_CAPSULE | Freq: Two times a day (BID) | ORAL | 0 refills | Status: AC

## 2021-01-06 MED ORDER — DOXYCYCLINE HYCLATE 100 MG PO TABS
100.0000 mg | ORAL_TABLET | Freq: Once | ORAL | Status: AC
  Administered 2021-01-06: 100 mg via ORAL
  Filled 2021-01-06: qty 1

## 2021-01-06 NOTE — Discharge Instructions (Signed)
Please follow-up with your primary care doctor.  Take antibiotic as prescribed.  If you develop any difficulty in breathing, fever, any episodes of passing out, return to ER for reassessment.

## 2021-01-07 NOTE — ED Provider Notes (Signed)
[facility]-EMERGENCY DEPT Provider Note   CSN: [id number] Arrival date & time: 01/05/21  2248     History Chief Complaint  Patient presents with  . Hypotension    Sandra Mccoy is a 70 y.o. female.  Presents to ER with concern for syncopal episode.  Patient underwent gallbladder surgery, cholecystectomy on 4/4.  Patient states that since discharge she was doing well, no major complaints.  However this evening she felt generally weak, family and were thought she might be slightly confused.  She said that she got lightheaded and thinks that she may have passed out.  States that with EMS she had resolution of her symptoms and she has no ongoing symptoms at present.  Has had mild nonproductive cough today.  No fever.  HPI     Past Medical History:  Diagnosis Date  . Acute cholecystitis 01/01/2021  . Diabetes mellitus without complication (HCC)   . Hyperlipidemia   . Hypertension     Patient Active Problem List   Diagnosis Date Noted  . Cholelithiasis 01/02/2021  . Acute cholecystitis 01/01/2021  . Hypothyroidism 01/01/2021  . Upper respiratory tract infection 07/13/2017  . Sore throat 07/13/2017  . Cough 07/13/2017  . Essential hypertension 07/13/2017  . Hyperlipidemia 07/13/2017  . Type 2 diabetes mellitus without complication, without long-term current use of insulin (HCC) 07/13/2017    Past Surgical History:  Procedure Laterality Date  . CHOLECYSTECTOMY N/A 01/03/2021   Procedure: LAPAROSCOPIC CHOLECYSTECTOMY WITH INTRAOPERATIVE CHOLANGIOGRAM AND UMBLICIAL HERNIA REPAIR;  Surgeon: Darnell Level, MD;  Location: [facility];  Service: General;  Laterality: N/A;     OB History   No obstetric history on file.     Family History  Problem Relation Age of Onset  . Hypertension Mother   . Hypertension Father     Social History   Tobacco Use  . Smoking status: Never Smoker  . Smokeless tobacco: Never Used  Substance Use Topics  . Alcohol use: No  . Drug  use: No    Home Medications Prior to Admission medications   Medication Sig Start Date End Date Taking? Authorizing Provider  acetaminophen (TYLENOL) 325 MG tablet Take 2 tablets (650 mg total) by mouth every 6 (six) hours as needed for mild pain (or Fever >/= 101). 01/04/21  Yes Meuth, Brooke A, PA-C  cholecalciferol (VITAMIN D3) 25 MCG (1000 UNIT) tablet Take 1,000 Units by mouth every morning.   Yes [provider]  clobetasol ointment (TEMOVATE) 0.05 % Apply 1 application topically 2 (two) times daily as needed (eczema).   Yes [provider]  doxycycline (VIBRAMYCIN) 100 MG capsule Take 1 capsule (100 mg total) by mouth 2 (two) times daily for 7 days. 01/06/21 01/13/21 Yes Dewayne Severe, Quitman Livings, MD  ezetimibe (ZETIA) 10 MG tablet Take 10 mg by mouth every morning.   Yes [provider]  GARLIC PO Take 1 tablet by mouth at bedtime.   Yes [provider]  Ginger, Zingiber officinalis, (GINGER PO) Take 1 tablet by mouth at bedtime.   Yes [provider]  levothyroxine (SYNTHROID) 88 MCG tablet Take 88 mcg by mouth every morning. 10/14/20  Yes [provider]  loratadine (CLARITIN) 10 MG tablet Take 10 mg by mouth at bedtime as needed for allergies (congestion).   Yes [provider]  Multiple Vitamin (MULTIVITAMIN WITH MINERALS) TABS tablet Take 1 tablet by mouth every morning.   Yes [provider]  olmesartan-hydrochlorothiazide (BENICAR HCT) 20-12.5 MG tablet Take 1  tablet by mouth every morning. 10/14/20  Yes [provider]  polyethylene glycol (MIRALAX / GLYCOLAX) 17 g packet Take 17 g by mouth daily as needed for mild constipation. 01/04/21   Meuth, Brooke A, PA-C  traMADol (ULTRAM) 50 MG tablet Take 1 tablet (50 mg total) by mouth every 6 (six) hours as needed for severe pain. 01/04/21   Meuth, Brooke A, PA-C    Allergies    Lipitor [atorvastatin] and Simvastatin  Review of Systems   Review of Systems   Constitutional: Negative for chills and fever.  HENT: Negative for ear pain and sore throat.   Eyes: Negative for pain and visual disturbance.  Respiratory: Positive for cough. Negative for shortness of breath.   Cardiovascular: Negative for chest pain and palpitations.  Gastrointestinal: Negative for abdominal pain and vomiting.  Genitourinary: Negative for dysuria and hematuria.  Musculoskeletal: Negative for arthralgias and back pain.  Skin: Negative for color change and rash.  Neurological: Positive for syncope and light-headedness. Negative for seizures.  All other systems reviewed and are negative.   Physical Exam Updated Vital Signs BP (!) 147/85   Pulse 62   Temp 97.6 F (36.4 C) (Oral)   Resp 13   Ht 5\' 3"  (1.6 m)   Wt 73 kg   SpO2 100%   BMI 28.52 kg/m   Physical Exam Vitals and nursing note reviewed.  Constitutional:      General: She is not in acute distress.    Appearance: She is well-developed.  HENT:     Head: Normocephalic and atraumatic.  Eyes:     Conjunctiva/sclera: Conjunctivae normal.  Cardiovascular:     Rate and Rhythm: Normal rate and regular rhythm.     Heart sounds: No murmur heard.   Pulmonary:     Effort: Pulmonary effort is normal. No respiratory distress.     Breath sounds: Normal breath sounds.  Abdominal:     Palpations: Abdomen is soft.     Tenderness: There is no abdominal tenderness.     Comments: Laparoscopic incision sites are clean dry and intact  Musculoskeletal:        General: No deformity or signs of injury.     Cervical back: Neck supple.  Skin:    General: Skin is warm and dry.     Capillary Refill: Capillary refill takes less than 2 seconds.  Neurological:     General: No focal deficit present.     Mental Status: She is alert.     ED Results / Procedures / Treatments   Labs (all labs ordered are listed, but only abnormal results are displayed) Labs Reviewed  COMPREHENSIVE METABOLIC PANEL - Abnormal; Notable  for the following components:      Result Value   Glucose, Bld 144 (*)    Albumin 3.4 (*)    All other components within normal limits  CBC - Abnormal; Notable for the following components:   WBC 12.5 (*)    All other components within normal limits  LIPASE, BLOOD  POC OCCULT BLOOD, ED    EKG EKG Interpretation  Date/Time:  Thursday January 06 2021 00:36:56 EDT Ventricular Rate:  62 PR Interval:  135 QRS Duration: 77 QT Interval:  388 QTC Calculation: 394 R Axis:   49 Text Interpretation: Sinus rhythm Confirmed by [id number] (Marianna Fuss) on 01/06/2021 1:02:47 AM   Radiology DG Chest 2 View  Result Date: 01/06/2021 CLINICAL DATA:  Syncope. EXAM: CHEST - 2 VIEW COMPARISON:  December 02, 2006 FINDINGS:  The lung volumes low. There are hazy airspace opacities at the lung bases with a possible left-sided pleural effusion. Heart size is unremarkable. Aortic calcifications are noted. There is no acute osseous abnormality. IMPRESSION: Hazy airspace opacities at the lung bases bilaterally may represent atelectasis or infiltrates. Possible left-sided pleural effusion. Electronically Signed   By: Katherine Mantle M.D.   On: 01/06/2021 02:45    Procedures Procedures   Medications Ordered in ED Medications  sodium chloride 0.9 % bolus 1,000 mL (0 mLs Intravenous Stopped 01/06/21 0205)  doxycycline (VIBRA-TABS) tablet 100 mg (100 mg Oral Given 01/06/21 0334)    ED Course  I have reviewed the triage vital signs and the nursing notes.  Pertinent labs & imaging results that were available during my care of the patient were reviewed by me and considered in my medical decision making (see chart for details).    MDM Rules/Calculators/A&P                         70 year old lady presents to ER after episode of near syncope, lightheadedness in the setting of recent cholecystectomy.  On physical exam, patient is remarkably well-appearing with stable vital signs.  She actually has no ongoing complaints.   Her basic labs were stable.  No drop in hemoglobin, electrolytes grossly within normal limits.  EKG with normal intervals, no events on telemetry monitoring.  CXR with possible infiltrates.  Upon further questioning, patient did endorse mild cough.  Given CXR finding, cough, will treat with course of doxycycline for possible pneumonia.  Recommend she have recheck with her primary doctor.  Will discharge home.   After the discussed management above, the patient was determined to be safe for discharge.  The patient was in agreement with this plan and all questions regarding their care were answered.  ED return precautions were discussed and the patient will return to the ED with any significant worsening of condition.   Final Clinical Impression(s) / ED Diagnoses Final diagnoses:  Syncope, unspecified syncope type  Pneumonia due to infectious organism, unspecified laterality, unspecified part of lung    Rx / DC Orders ED Discharge Orders         Ordered    doxycycline (VIBRAMYCIN) 100 MG capsule  2 times daily        01/06/21 0324           Milagros Loll, MD 01/07/21 [id number]

## 2021-01-12 ENCOUNTER — Other Ambulatory Visit: Payer: Self-pay | Admitting: Family Medicine

## 2021-01-12 DIAGNOSIS — M8580 Other specified disorders of bone density and structure, unspecified site: Secondary | ICD-10-CM

## 2021-02-08 ENCOUNTER — Other Ambulatory Visit: Payer: Self-pay | Admitting: Family Medicine

## 2021-02-08 ENCOUNTER — Ambulatory Visit
Admission: RE | Admit: 2021-02-08 | Discharge: 2021-02-08 | Disposition: A | Discharge status: Home / Self Care | Payer: Medicare Other | Source: Ambulatory Visit | Attending: Family Medicine | Admitting: Family Medicine

## 2021-02-08 DIAGNOSIS — R9389 Abnormal findings on diagnostic imaging of other specified body structures: Secondary | ICD-10-CM

## 2021-06-13 ENCOUNTER — Encounter: Payer: Self-pay | Admitting: Endocrinology

## 2021-06-13 ENCOUNTER — Ambulatory Visit (INDEPENDENT_AMBULATORY_CARE_PROVIDER_SITE_OTHER): Payer: Medicare Other | Admitting: Endocrinology

## 2021-06-13 ENCOUNTER — Other Ambulatory Visit: Payer: Self-pay

## 2021-06-13 LAB — BASIC METABOLIC PANEL
BUN: 18 mg/dL (ref 6–23)
CO2: 28 mEq/L (ref 19–32)
Calcium: 10.9 mg/dL — ABNORMAL HIGH (ref 8.4–10.5)
Chloride: 105 mEq/L (ref 96–112)
Creatinine, Ser: 0.87 mg/dL (ref 0.40–1.20)
GFR: 67.89 mL/min (ref >60.00)
Glucose, Bld: 62 mg/dL — ABNORMAL LOW (ref 70–99)
Potassium: 4.3 mEq/L (ref 3.5–5.1)
Sodium: 141 mEq/L (ref 135–145)

## 2021-06-13 MED ORDER — DILTIAZEM HCL ER COATED BEADS 120 MG PO TB24: 120.0000 mg | ORAL_TABLET | Freq: Every day | ORAL | 11 refills | Status: DC

## 2021-06-13 NOTE — Patient Instructions (Addendum)
It is not yet clear why your calcium and vitamin-D are off.   To fine out, we will need to take this complex situation in stages Blood tests are requested for you today.  We'll let you know about the results.  I have sent a prescription to your pharmacy, for an additional blood pressure pill. Please come back for a follow-up appointment in 2 weeks.

## 2021-06-13 NOTE — Progress Notes (Signed)
Subjective:    Patient ID: Sandra Mccoy, female    DOB: 1951/02/26, 70 y.o.   MRN: [id number]  HPI Pt is referred by Dr Azucena Cecil, for hypercalcemia.  Pt was noted to have hypercalcemia in 2022.  she has never had osteoporosis, urolithiasis, sarcoidosis, cancer, PUD, pancreatitis.  Only bony fracture was left forearm as a child (traumatic).  she does not take vitamin-A supplement. Pt denies taking antacids, Li++, or HCTZ.  She takes Vit-D, 2000 units/d.   Past Medical History:  Diagnosis Date   Acute cholecystitis 01/01/2021   Diabetes mellitus without complication (HCC)    Hyperlipidemia    Hypertension     Past Surgical History:  Procedure Laterality Date   CHOLECYSTECTOMY N/A 01/03/2021   Procedure: LAPAROSCOPIC CHOLECYSTECTOMY WITH INTRAOPERATIVE CHOLANGIOGRAM AND UMBLICIAL HERNIA REPAIR;  Surgeon: Darnell Level, MD;  Location: [facility];  Service: General;  Laterality: N/A;    Social History   Socioeconomic History   Marital status: Married    Spouse name: Not on file   Number of children: Not on file   Years of education: Not on file   Highest education level: Not on file  Occupational History   Not on file  Tobacco Use   Smoking status: Never   Smokeless tobacco: Never  Substance and Sexual Activity   Alcohol use: No   Drug use: No   Sexual activity: Not on file  Other Topics Concern   Not on file  Social History Narrative   Not on file   Social Determinants of Health   Financial Resource Strain: Not on file  Food Insecurity: Not on file  Transportation Needs: Not on file  Physical Activity: Not on file  Stress: Not on file  Social Connections: Not on file  Intimate Partner Violence: Not on file    Current Outpatient Medications on File Prior to Visit  Medication Sig Dispense Refill   acetaminophen (TYLENOL) 325 MG tablet Take 2 tablets (650 mg total) by mouth every 6 (six) hours as needed for mild pain (or Fever >/= 101).     cholecalciferol (VITAMIN D3) 25 MCG  (1000 UNIT) tablet Take 1,000 Units by mouth every morning.     clobetasol ointment (TEMOVATE) 0.05 % Apply 1 application topically 2 (two) times daily as needed (eczema).     ezetimibe (ZETIA) 10 MG tablet Take 10 mg by mouth every morning.     GARLIC PO Take 1 tablet by mouth at bedtime.     Ginger, Zingiber officinalis, (GINGER PO) Take 1 tablet by mouth at bedtime.     levothyroxine (SYNTHROID) 88 MCG tablet Take 88 mcg by mouth every morning.     loratadine (CLARITIN) 10 MG tablet Take 10 mg by mouth at bedtime as needed for allergies (congestion).     Multiple Vitamin (MULTIVITAMIN WITH MINERALS) TABS tablet Take 1 tablet by mouth every morning.     olmesartan-hydrochlorothiazide (BENICAR HCT) 20-12.5 MG tablet Take 1 tablet by mouth every morning.     polyethylene glycol (MIRALAX / GLYCOLAX) 17 g packet Take 17 g by mouth daily as needed for mild constipation. 14 each 0   traMADol (ULTRAM) 50 MG tablet Take 1 tablet (50 mg total) by mouth every 6 (six) hours as needed for severe pain. 15 tablet 0   No current facility-administered medications on file prior to visit.    Allergies  Allergen Reactions   Lipitor [Atorvastatin] Nausea And Vomiting and Other (See Comments)    Made her loopy  Simvastatin Other (See Comments)    Increased LFT's    Family History  Problem Relation Age of Onset   Hypertension Mother    Hypertension Father    Hypercalcemia Neg Hx     BP (!) 160/80 (BP Location: Right Arm, Patient Position: Sitting, Cuff Size: Normal)   Pulse 82   Ht 5\' 3"  (1.6 m)   Wt 161 lb 6.4 oz (73.2 kg)   SpO2 98%   BMI 28.59 kg/m     Review of Systems Denies weight loss, visual loss, polyuria, depression, and back pain.      Objective:   Physical Exam VS: see vs page GEN: no distress HEAD: head: no deformity eyes: no periorbital swelling, no proptosis external nose and ears are normal NECK: supple, thyroid is not enlarged CHEST WALL: no deformity LUNGS: clear to  auscultation CV: reg rate and rhythm, no murmur.  MUSCULOSKELETAL: gait is normal and steady EXTEMITIES: no deformity.  no leg edema NEURO:  readily moves all 4's.  sensation is intact to touch on all 4's SKIN:  Normal texture and temperature.  No rash or suspicious lesion is visible.   NODES:  None palpable at the neck PSYCH: alert, well-oriented.  Does not appear anxious nor depressed.   outside test results are reviewed: Ca++=10.5 PTH=71 TSH=1.2  I have reviewed outside records, and summarized: Pt was noted to have elevated Ca++, and referred here.  Vit-D and synthroid were both reduced due to abnormal labs.     Assessment & Plan:  Vit-D def: recheck today HCTZ: this could increase Ca++, but we can't stop now, due to HTN HTN: uncontrolled  Patient Instructions  It is not yet clear why your calcium and vitamin-D are off.   To fine out, we will need to take this complex situation in stages Blood tests are requested for you today.  We'll let you know about the results.  I have sent a prescription to your pharmacy, for an additional blood pressure pill. Please come back for a follow-up appointment in 2 weeks.

## 2021-06-14 LAB — PTH, INTACT AND CALCIUM
Calcium: 10.9 mg/dL — ABNORMAL HIGH (ref 8.6–10.4)
PTH: 54 pg/mL (ref 16–77)

## 2021-06-14 LAB — VITAMIN D 25 HYDROXY (VIT D DEFICIENCY, FRACTURES): VITD: 77.09 ng/mL (ref 30.00–100.00)

## 2021-07-12 ENCOUNTER — Ambulatory Visit
Admission: RE | Admit: 2021-07-12 | Discharge: 2021-07-12 | Disposition: A | Discharge status: Home / Self Care | Payer: Medicare Other | Source: Ambulatory Visit | Attending: Family Medicine | Admitting: Family Medicine

## 2021-07-12 ENCOUNTER — Other Ambulatory Visit: Payer: Self-pay

## 2021-07-12 DIAGNOSIS — M8580 Other specified disorders of bone density and structure, unspecified site: Secondary | ICD-10-CM

## 2021-07-15 ENCOUNTER — Other Ambulatory Visit: Payer: Self-pay

## 2021-07-15 ENCOUNTER — Ambulatory Visit (INDEPENDENT_AMBULATORY_CARE_PROVIDER_SITE_OTHER): Payer: Medicare Other | Admitting: Endocrinology

## 2021-07-15 MED ORDER — OLMESARTAN MEDOXOMIL 40 MG PO TABS: 40.0000 mg | ORAL_TABLET | Freq: Every day | ORAL | 3 refills | Status: AC

## 2021-07-15 MED ORDER — DILTIAZEM HCL ER COATED BEADS 120 MG PO TB24: 120.0000 mg | ORAL_TABLET | Freq: Every day | ORAL | 3 refills | Status: DC

## 2021-07-15 NOTE — Progress Notes (Signed)
Subjective:    Patient ID: Sandra Mccoy, female    DOB: [birth year], 70 y.o.   MRN: [id number]  HPI Pt returns for f/u of hypercalcemia (dx'ed 2022; she has never had osteoporosis or urolithiasis; only bony fracture was left forearm as a child (traumatic); PTH is high-normal).  She takes Vit-D, 2000 units/d.  Past Medical History:  Diagnosis Date   Acute cholecystitis 01/01/2021   Diabetes mellitus without complication (HCC)    Hyperlipidemia    Hypertension     Past Surgical History:  Procedure Laterality Date   CHOLECYSTECTOMY N/A 01/03/2021   Procedure: LAPAROSCOPIC CHOLECYSTECTOMY WITH INTRAOPERATIVE CHOLANGIOGRAM AND UMBLICIAL HERNIA REPAIR;  Surgeon: Darnell Level, MD;  Location: [facility];  Service: General;  Laterality: N/A;    Social History   Socioeconomic History   Marital status: Married    Spouse name: Not on file   Number of children: Not on file   Years of education: Not on file   Highest education level: Not on file  Occupational History   Not on file  Tobacco Use   Smoking status: Never   Smokeless tobacco: Never  Substance and Sexual Activity   Alcohol use: No   Drug use: No   Sexual activity: Not on file  Other Topics Concern   Not on file  Social History Narrative   Not on file   Social Determinants of Health   Financial Resource Strain: Not on file  Food Insecurity: Not on file  Transportation Needs: Not on file  Physical Activity: Not on file  Stress: Not on file  Social Connections: Not on file  Intimate Partner Violence: Not on file    Current Outpatient Medications on File Prior to Visit  Medication Sig Dispense Refill   acetaminophen (TYLENOL) 325 MG tablet Take 2 tablets (650 mg total) by mouth every 6 (six) hours as needed for mild pain (or Fever >/= 101).     cholecalciferol (VITAMIN D3) 25 MCG (1000 UNIT) tablet Take 1,000 Units by mouth every morning.     clobetasol ointment (TEMOVATE) 0.05 % Apply 1 application topically 2 (two) times  daily as needed (eczema).     ezetimibe (ZETIA) 10 MG tablet Take 10 mg by mouth every morning.     GARLIC PO Take 1 tablet by mouth at bedtime.     Ginger, Zingiber officinalis, (GINGER PO) Take 1 tablet by mouth at bedtime.     levothyroxine (SYNTHROID) 88 MCG tablet Take 88 mcg by mouth every morning.     loratadine (CLARITIN) 10 MG tablet Take 10 mg by mouth at bedtime as needed for allergies (congestion).     Multiple Vitamin (MULTIVITAMIN WITH MINERALS) TABS tablet Take 1 tablet by mouth every morning.     polyethylene glycol (MIRALAX / GLYCOLAX) 17 g packet Take 17 g by mouth daily as needed for mild constipation. 14 each 0   traMADol (ULTRAM) 50 MG tablet Take 1 tablet (50 mg total) by mouth every 6 (six) hours as needed for severe pain. 15 tablet 0   No current facility-administered medications on file prior to visit.    Allergies  Allergen Reactions   Lipitor [Atorvastatin] Nausea And Vomiting and Other (See Comments)    Made her loopy   Simvastatin Other (See Comments)    Increased LFT's    Family History  Problem Relation Age of Onset   Hypertension Mother    Hypertension Father    Hypercalcemia Neg Hx     BP 140/80 (  BP Location: Right Arm, Patient Position: Sitting, Cuff Size: Normal)   Pulse 74   Ht 5\' 3"  (1.6 m)   Wt 161 lb 12.8 oz (73.4 kg)   SpO2 98%   BMI 28.66 kg/m    Review of Systems     Objective:   Physical Exam VITAL SIGNS:  See vs page GENERAL: no distress EXT: no leg edema  Lab Results  Component Value Date   PTH 54 06/13/2021   CALCIUM 10.9 (H) 06/13/2021   CALCIUM 10.9 (H) 06/13/2021       Assessment & Plan:  HTN: well-controlled for now, but we may need to add lasix Hypercalcemia: uncontrolled.  I told pt plan is to recheck off HCTZ  Patient Instructions  we will need to take this complex situation in stages I have sent a prescription to your pharmacy, to change olmesartan-HCTZ to just olmesartan 40 mg per day.   Please  continue the same other medications. Our goals now are to have a normal blood pressure, and to see how the calcium is off the HCTZ Please come back for a follow-up appointment in 1 month.

## 2021-07-15 NOTE — Patient Instructions (Addendum)
we will need to take this complex situation in stages I have sent a prescription to your pharmacy, to change olmesartan-HCTZ to just olmesartan 40 mg per day.   Please continue the same other medications. Our goals now are to have a normal blood pressure, and to see how the calcium is off the HCTZ Please come back for a follow-up appointment in 1 month.

## 2021-08-17 ENCOUNTER — Ambulatory Visit (INDEPENDENT_AMBULATORY_CARE_PROVIDER_SITE_OTHER): Payer: Medicare Other | Admitting: Endocrinology

## 2021-08-17 ENCOUNTER — Other Ambulatory Visit: Payer: Self-pay

## 2021-08-17 LAB — VITAMIN D 25 HYDROXY (VIT D DEFICIENCY, FRACTURES): VITD: 63.1 ng/mL (ref 30.00–100.00)

## 2021-08-17 MED ORDER — FUROSEMIDE 20 MG PO TABS: 20.0000 mg | ORAL_TABLET | Freq: Every day | ORAL | 3 refills | Status: AC

## 2021-08-17 NOTE — Progress Notes (Signed)
Subjective:    Patient ID: Sandra Mccoy, female    DOB: Feb 07, 1951, 70 y.o.   MRN: [id number]  HPI Pt returns for f/u of hypercalcemia (dx'ed 2022; she has never had osteoporosis or urolithiasis; only bony fracture was left forearm as a child (traumatic); PTH is high-normal; AP is normal).  She takes Vit-D, 1000 units/d.  Past Medical History:  Diagnosis Date   Acute cholecystitis 01/01/2021   Diabetes mellitus without complication (HCC)    Hyperlipidemia    Hypertension     Past Surgical History:  Procedure Laterality Date   CHOLECYSTECTOMY N/A 01/03/2021   Procedure: LAPAROSCOPIC CHOLECYSTECTOMY WITH INTRAOPERATIVE CHOLANGIOGRAM AND UMBLICIAL HERNIA REPAIR;  Surgeon: Darnell Level, MD;  Location: [facility];  Service: General;  Laterality: N/A;    Social History   Socioeconomic History   Marital status: Married    Spouse name: Not on file   Number of children: Not on file   Years of education: Not on file   Highest education level: Not on file  Occupational History   Not on file  Tobacco Use   Smoking status: Never   Smokeless tobacco: Never  Substance and Sexual Activity   Alcohol use: No   Drug use: No   Sexual activity: Not on file  Other Topics Concern   Not on file  Social History Narrative   Not on file   Social Determinants of Health   Financial Resource Strain: Not on file  Food Insecurity: Not on file  Transportation Needs: Not on file  Physical Activity: Not on file  Stress: Not on file  Social Connections: Not on file  Intimate Partner Violence: Not on file    Current Outpatient Medications on File Prior to Visit  Medication Sig Dispense Refill   acetaminophen (TYLENOL) 325 MG tablet Take 2 tablets (650 mg total) by mouth every 6 (six) hours as needed for mild pain (or Fever >/= 101).     cholecalciferol (VITAMIN D3) 25 MCG (1000 UNIT) tablet Take 1,000 Units by mouth every morning.     clobetasol ointment (TEMOVATE) 0.05 % Apply 1 application topically 2  (two) times daily as needed (eczema).     diltiazem (CARDIZEM LA) 120 MG 24 hr tablet Take 1 tablet (120 mg total) by mouth daily. 90 tablet 3   ezetimibe (ZETIA) 10 MG tablet Take 10 mg by mouth every morning.     GARLIC PO Take 1 tablet by mouth at bedtime.     Ginger, Zingiber officinalis, (GINGER PO) Take 1 tablet by mouth at bedtime.     levothyroxine (SYNTHROID) 88 MCG tablet Take 88 mcg by mouth every morning.     loratadine (CLARITIN) 10 MG tablet Take 10 mg by mouth at bedtime as needed for allergies (congestion).     Multiple Vitamin (MULTIVITAMIN WITH MINERALS) TABS tablet Take 1 tablet by mouth every morning.     olmesartan (BENICAR) 40 MG tablet Take 1 tablet (40 mg total) by mouth daily. 90 tablet 3   polyethylene glycol (MIRALAX / GLYCOLAX) 17 g packet Take 17 g by mouth daily as needed for mild constipation. 14 each 0   traMADol (ULTRAM) 50 MG tablet Take 1 tablet (50 mg total) by mouth every 6 (six) hours as needed for severe pain. 15 tablet 0   No current facility-administered medications on file prior to visit.    Allergies  Allergen Reactions   Lipitor [Atorvastatin] Nausea And Vomiting and Other (See Comments)    Made her  loopy   Simvastatin Other (See Comments)    Increased LFT's    Family History  Problem Relation Age of Onset   Hypertension Mother    Hypertension Father    Hypercalcemia Neg Hx     BP (!) 144/60 (BP Location: Right Arm, Patient Position: Sitting, Cuff Size: Large)   Pulse 76   Ht 5\' 3"  (1.6 m)   Wt 165 lb 12.8 oz (75.2 kg)   SpO2 99%   BMI 29.37 kg/m    Review of Systems     Objective:   Physical Exam VITAL SIGNS:  See vs page GENERAL: no distress Ext: no leg edema.  Lab Results  Component Value Date   CREATININE 0.87 06/13/2021   BUN 18 06/13/2021   NA 141 06/13/2021   K 4.3 06/13/2021   CL 105 06/13/2021   CO2 28 06/13/2021    Lab Results  Component Value Date   ALT 41 01/05/2021   AST 23 01/05/2021   ALKPHOS 79  01/05/2021   BILITOT 0.5 01/05/2021   Lab Results  Component Value Date   PTH 53 08/17/2021   CALCIUM 10.3 08/17/2021      Assessment & Plan:  Hypercalcemia: uncertain etiology and prognosis.   HTN: uncontrolled.   Patient Instructions  Blood tests are requested for you today.  We'll let you know about the results.  I have sent a prescription to your pharmacy, to add a different type of fluid pill. If no cause of the high calcium is found, the next step is to check a 24HR urine collection for calcium. Please come back for a follow-up appointment in 2 months.

## 2021-08-17 NOTE — Patient Instructions (Addendum)
Blood tests are requested for you today.  We'll let you know about the results.  I have sent a prescription to your pharmacy, to add a different type of fluid pill. If no cause of the high calcium is found, the next step is to check a 24HR urine collection for calcium. Please come back for a follow-up appointment in 2 months.

## 2021-08-24 LAB — PTH, INTACT AND CALCIUM
Calcium: 10.3 mg/dL (ref 8.6–10.4)
PTH: 53 pg/mL (ref 16–77)

## 2021-08-24 LAB — VITAMIN A: Vitamin A (Retinoic Acid): 55 ug/dL (ref 38–98)

## 2021-08-24 LAB — VITAMIN D 1,25 DIHYDROXY
Vitamin D 1, 25 (OH)2 Total: 44 pg/mL (ref 18–72)
Vitamin D2 1, 25 (OH)2: <8 pg/mL
Vitamin D3 1, 25 (OH)2: 44 pg/mL

## 2021-08-24 LAB — PTH-RELATED PEPTIDE: PTH-Related Protein (PTH-RP): 9 pg/mL — ABNORMAL LOW (ref 11–20)

## 2021-10-25 ENCOUNTER — Ambulatory Visit (INDEPENDENT_AMBULATORY_CARE_PROVIDER_SITE_OTHER): Payer: Medicare Other | Admitting: Endocrinology

## 2021-10-25 ENCOUNTER — Other Ambulatory Visit: Payer: Self-pay

## 2021-10-25 LAB — VITAMIN D 25 HYDROXY (VIT D DEFICIENCY, FRACTURES): VITD: 71.86 ng/mL (ref 30.00–100.00)

## 2021-10-25 NOTE — Patient Instructions (Addendum)
Blood tests are requested for you today.  We'll let you know about the results.   If no cause of the high calcium is found, the next step is to check a 24HR urine collection for calcium.   I would be happy to see you back here as needed.

## 2021-10-25 NOTE — Progress Notes (Signed)
Subjective:    Patient ID: Sandra Mccoy, female    DOB: 11-Jan-1951, 71 y.o.   MRN: [id number]  HPI Pt returns for f/u of hypercalcemia (dx'ed 2022; she has never had urolithiasis; only bony fracture was left forearm as a child (traumatic); DEXA (2022): worst T-score was -1.6; w/u was neg (except 24HR urine not done yet), and Ca++ returned to normal after HCTZ was changed to lasix).  She takes Vit-D, 1000 units/d.  pt states she feels well in general.   Past Medical History:  Diagnosis Date   Acute cholecystitis 01/01/2021   Diabetes mellitus without complication (HCC)    Hyperlipidemia    Hypertension     Past Surgical History:  Procedure Laterality Date   CHOLECYSTECTOMY N/A 01/03/2021   Procedure: LAPAROSCOPIC CHOLECYSTECTOMY WITH INTRAOPERATIVE CHOLANGIOGRAM AND UMBLICIAL HERNIA REPAIR;  Surgeon: Darnell Level, MD;  Location: [facility];  Service: General;  Laterality: N/A;    Social History   Socioeconomic History   Marital status: Married    Spouse name: Not on file   Number of children: Not on file   Years of education: Not on file   Highest education level: Not on file  Occupational History   Not on file  Tobacco Use   Smoking status: Never   Smokeless tobacco: Never  Substance and Sexual Activity   Alcohol use: No   Drug use: No   Sexual activity: Not on file  Other Topics Concern   Not on file  Social History Narrative   Not on file   Social Determinants of Health   Financial Resource Strain: Not on file  Food Insecurity: Not on file  Transportation Needs: Not on file  Physical Activity: Not on file  Stress: Not on file  Social Connections: Not on file  Intimate Partner Violence: Not on file    Current Outpatient Medications on File Prior to Visit  Medication Sig Dispense Refill   acetaminophen (TYLENOL) 325 MG tablet Take 2 tablets (650 mg total) by mouth every 6 (six) hours as needed for mild pain (or Fever >/= 101).     clobetasol ointment (TEMOVATE) 0.05 %  Apply 1 application topically 2 (two) times daily as needed (eczema).     diltiazem (CARDIZEM LA) 120 MG 24 hr tablet Take 1 tablet (120 mg total) by mouth daily. 90 tablet 3   ezetimibe (ZETIA) 10 MG tablet Take 10 mg by mouth every morning.     furosemide (LASIX) 20 MG tablet Take 1 tablet (20 mg total) by mouth daily. 90 tablet 3   GARLIC PO Take 1 tablet by mouth at bedtime.     Ginger, Zingiber officinalis, (GINGER PO) Take 1 tablet by mouth at bedtime.     levothyroxine (SYNTHROID) 88 MCG tablet Take 88 mcg by mouth every morning.     loratadine (CLARITIN) 10 MG tablet Take 10 mg by mouth at bedtime as needed for allergies (congestion).     Multiple Vitamin (MULTIVITAMIN WITH MINERALS) TABS tablet Take 1 tablet by mouth every morning.     olmesartan (BENICAR) 40 MG tablet Take 1 tablet (40 mg total) by mouth daily. 90 tablet 3   polyethylene glycol (MIRALAX / GLYCOLAX) 17 g packet Take 17 g by mouth daily as needed for mild constipation. 14 each 0   traMADol (ULTRAM) 50 MG tablet Take 1 tablet (50 mg total) by mouth every 6 (six) hours as needed for severe pain. 15 tablet 0   No current facility-administered medications on  file prior to visit.    Allergies  Allergen Reactions   Lipitor [Atorvastatin] Nausea And Vomiting and Other (See Comments)    Made her loopy   Simvastatin Other (See Comments)    Increased LFT's    Family History  Problem Relation Age of Onset   Hypertension Mother    Hypertension Father    Hypercalcemia Neg Hx     BP 120/70    Pulse 79    Ht 5\' 3"  (1.6 m)    Wt 158 lb 6.4 oz (71.8 kg)    SpO2 97%    BMI 28.06 kg/m    Review of Systems     Objective:   Physical Exam    Lab Results  Component Value Date   PTH 72 10/25/2021   CALCIUM 11.0 (H) 10/25/2021       Assessment & Plan:  Hypercalcemia: uncontrolled.  Check 24-HR urine Ca++ Vit-D def: recheck today HTN: well-controlled.  Please continue the same medications, including Lasix.

## 2021-10-26 LAB — PTH, INTACT AND CALCIUM
Calcium: 11 mg/dL — ABNORMAL HIGH (ref 8.6–10.4)
PTH: 72 pg/mL (ref 16–77)

## 2021-12-23 ENCOUNTER — Telehealth: Payer: Self-pay

## 2021-12-23 NOTE — Telephone Encounter (Signed)
Atc pt to cancel appt. Mailbox full. ?

## 2021-12-23 NOTE — Telephone Encounter (Signed)
Patient wants Dr to call her and let her know why she needs appt so soon ? ? ?Please call and advise  ?

## 2021-12-28 ENCOUNTER — Ambulatory Visit: Payer: Medicare Other | Admitting: Endocrinology

## 2022-03-01 ENCOUNTER — Other Ambulatory Visit: Payer: Self-pay | Admitting: Endocrinology

## 2022-03-02 ENCOUNTER — Other Ambulatory Visit (INDEPENDENT_AMBULATORY_CARE_PROVIDER_SITE_OTHER): Payer: Medicare Other

## 2022-03-02 LAB — BASIC METABOLIC PANEL
BUN: 18 mg/dL (ref 6–23)
CO2: 30 mEq/L (ref 19–32)
Calcium: 10.4 mg/dL (ref 8.4–10.5)
Chloride: 103 mEq/L (ref 96–112)
Creatinine, Ser: 1.03 mg/dL (ref 0.40–1.20)
GFR: 55.16 mL/min — ABNORMAL LOW (ref >60.00)
Glucose, Bld: 114 mg/dL — ABNORMAL HIGH (ref 70–99)
Potassium: 4.1 mEq/L (ref 3.5–5.1)
Sodium: 141 mEq/L (ref 135–145)

## 2022-03-06 ENCOUNTER — Ambulatory Visit (INDEPENDENT_AMBULATORY_CARE_PROVIDER_SITE_OTHER): Payer: Medicare Other | Admitting: Endocrinology

## 2022-03-06 ENCOUNTER — Encounter: Payer: Self-pay | Admitting: Endocrinology

## 2022-03-06 VITALS — BP 114/62 | HR 57 | Ht 63.0 in | Wt 160.8 lb

## 2022-03-06 DIAGNOSIS — E210 Primary hyperparathyroidism: Secondary | ICD-10-CM | POA: Diagnosis not present

## 2022-03-06 DIAGNOSIS — I10 Essential (primary) hypertension: Secondary | ICD-10-CM | POA: Diagnosis not present

## 2022-03-06 NOTE — Progress Notes (Signed)
Patient ID: Sandra Mccoy, female   DOB: 1950/10/06, 71 y.o.   MRN: [id number]           Chief complaint: High calcium  History of Present Illness:  dx'ed 2022; she has never had urolithiasis; only bony fracture was left forearm as a child (traumatic); DEXA (2022): worst T-score was -1.6; w/u was neg (except 24HR urine not done yet), and Ca++ returned to normal after HCTZ was changed to lasix).    Review of records show that she has had a high calcium since at least early April 2022 first diagnosed by her PCP  Calcium level has been fluctuating significantly with highest level recently was 11.0 Although initially her calcium improved with stopping HCTZ back up to 11 as of 10/25/2021 She has otherwise been asymptomatic without any bone pain, history of fractures Urine calcium collection was deferred when her calcium had come back to normal  PTH level has been normal  Lab Results  Component Value Date   CALCIUM 10.4 03/02/2022   CALCIUM 11.0 (H) 10/25/2021   CALCIUM 10.3 08/17/2021   CALCIUM 10.9 (H) 06/13/2021   CALCIUM 10.9 (H) 06/13/2021   CALCIUM 10.1 01/05/2021   CALCIUM 9.7 01/03/2021   CALCIUM 9.9 01/02/2021   CALCIUM 10.9 (H) 01/01/2021   No history of thyroid disease  Prior serologic and radiologic studies have included:  Lab Results  Component Value Date   PTH 72 10/25/2021   CALCIUM 10.4 03/02/2022      25 (OH) Vitamin D level    Lab Results  Component Value Date   VD25OH 71.86 10/25/2021   VD25OH 63.10 08/17/2021    Prior treatment has included  Allergies as of 03/06/2022       Reactions   Lipitor [atorvastatin] Nausea And Vomiting, Other (See Comments)   Made her loopy   Simvastatin Other (See Comments)   Increased LFT's        Medication List        Accurate as of March 06, 2022  2:30 PM. If you have any questions, ask your nurse or doctor.          acetaminophen 325 MG tablet Commonly known as: TYLENOL Take 2 tablets (650 mg total) by  mouth every 6 (six) hours as needed for mild pain (or Fever >/= 101).   clobetasol ointment 0.05 % Commonly known as: TEMOVATE Apply 1 application topically 2 (two) times daily as needed (eczema).   diltiazem 120 MG 24 hr tablet Commonly known as: CARDIZEM LA Take 1 tablet (120 mg total) by mouth daily.   ezetimibe 10 MG tablet Commonly known as: ZETIA Take 10 mg by mouth every morning.   furosemide 20 MG tablet Commonly known as: LASIX Take 1 tablet (20 mg total) by mouth daily.   GARLIC PO Take 1 tablet by mouth at bedtime.   GINGER PO Take 1 tablet by mouth at bedtime.   levothyroxine 88 MCG tablet Commonly known as: SYNTHROID Take 88 mcg by mouth every morning.   loratadine 10 MG tablet Commonly known as: CLARITIN Take 10 mg by mouth at bedtime as needed for allergies (congestion).   multivitamin with minerals Tabs tablet Take 1 tablet by mouth every morning.   olmesartan 40 MG tablet Commonly known as: BENICAR Take 1 tablet (40 mg total) by mouth daily.   polyethylene glycol 17 g packet Commonly known as: MIRALAX / GLYCOLAX Take 17 g by mouth daily as needed for mild constipation.   traMADol 50 MG  tablet Commonly known as: ULTRAM Take 1 tablet (50 mg total) by mouth every 6 (six) hours as needed for severe pain.        Allergies:  Allergies  Allergen Reactions   Lipitor [Atorvastatin] Nausea And Vomiting and Other (See Comments)    Made her loopy   Simvastatin Other (See Comments)    Increased LFT's    Past Medical History:  Diagnosis Date   Acute cholecystitis 01/01/2021   Diabetes mellitus without complication (HCC)    Hyperlipidemia    Hypertension     Past Surgical History:  Procedure Laterality Date   CHOLECYSTECTOMY N/A 01/03/2021   Procedure: LAPAROSCOPIC CHOLECYSTECTOMY WITH INTRAOPERATIVE CHOLANGIOGRAM AND UMBLICIAL HERNIA REPAIR;  Surgeon: Darnell Level, MD;  Location: [facility];  Service: General;  Laterality: N/A;    Family History   Problem Relation Age of Onset   Hypertension Mother    Hypertension Father    Hypercalcemia Neg Hx     Social History:  reports that she has never smoked. She has never used smokeless tobacco. She reports that she does not drink alcohol and does not use drugs.  Review of Systems  Last TSH normal with PCP  No results found for: TSH  Noted by PCP for hypertension regularly  EXAM:  BP 114/62   Pulse (!) 57   Ht 5\' 3"  (1.6 m)   Wt 160 lb 12.8 oz (72.9 kg)   SpO2 99%   BMI 28.48 kg/m   GENERAL: Averagely built and nourished She looks well  THYROID:  Not palpable.  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 Discussed potential  effects of hyperparathyroidism long-term on bone health, kidney stones and kidney function Explained to patient that surgery is indicated only there are symptoms of high calcium, calcium level over 1 point above the normal range or known osteoporosis. Demonstrated the anatomy of the thyroid and parathyroid and discussed parathyroid adenomas  Explained that since her calcium has been consistently 11 or below she does not need to consider surgery and most recent calcium being normal does not need to further evaluate with urinary calcium  HYPERTENSION: Well controlled and she will discuss any management changes with her PCP  She can now be followed by her PCP and follow-up as needed  Recommend getting bone density every 3 years  03/06/2022, 2:30 PM   Total visit time including counseling = 30 minutes

## 2022-10-09 ENCOUNTER — Emergency Department (HOSPITAL_COMMUNITY): Payer: Medicare Other

## 2022-10-09 ENCOUNTER — Encounter (HOSPITAL_COMMUNITY): Payer: Self-pay | Admitting: Family Medicine

## 2022-10-09 ENCOUNTER — Other Ambulatory Visit: Payer: Self-pay

## 2022-10-09 ENCOUNTER — Observation Stay (HOSPITAL_COMMUNITY)
Admission: EM | Admit: 2022-10-09 | Discharge: 2022-10-10 | Disposition: A | Discharge status: Home / Self Care | Payer: Medicare Other | Attending: Family Medicine | Admitting: Family Medicine

## 2022-10-09 DIAGNOSIS — Z1152 Encounter for screening for COVID-19: Secondary | ICD-10-CM | POA: Diagnosis not present

## 2022-10-09 DIAGNOSIS — E119 Type 2 diabetes mellitus without complications: Secondary | ICD-10-CM | POA: Diagnosis not present

## 2022-10-09 DIAGNOSIS — Z79899 Other long term (current) drug therapy: Secondary | ICD-10-CM | POA: Insufficient documentation

## 2022-10-09 DIAGNOSIS — I959 Hypotension, unspecified: Secondary | ICD-10-CM | POA: Insufficient documentation

## 2022-10-09 DIAGNOSIS — R55 Syncope and collapse: Principal | ICD-10-CM | POA: Diagnosis present

## 2022-10-09 DIAGNOSIS — I10 Essential (primary) hypertension: Secondary | ICD-10-CM | POA: Diagnosis not present

## 2022-10-09 HISTORY — DX: Dermatitis, unspecified: L30.9

## 2022-10-09 LAB — CBC
HCT: 38.2 % (ref 36.0–46.0)
Hemoglobin: 12.5 g/dL (ref 12.0–15.0)
MCH: 31.5 pg (ref 26.0–34.0)
MCHC: 32.7 g/dL (ref 30.0–36.0)
MCV: 96.2 fL (ref 80.0–100.0)
Platelets: 206 10*3/uL (ref 150–400)
RBC: 3.97 MIL/uL (ref 3.87–5.11)
RDW: 13.2 % (ref 11.5–15.5)
WBC: 12.1 10*3/uL — ABNORMAL HIGH (ref 4.0–10.5)
nRBC: 0 % (ref 0.0–0.2)

## 2022-10-09 LAB — COMPREHENSIVE METABOLIC PANEL WITH GFR
ALT: 21 U/L (ref 0–44)
AST: 21 U/L (ref 15–41)
Albumin: 3.4 g/dL — ABNORMAL LOW (ref 3.5–5.0)
Alkaline Phosphatase: 63 U/L (ref 38–126)
Anion gap: 7 (ref 5–15)
BUN: 17 mg/dL (ref 8–23)
CO2: 27 mmol/L (ref 22–32)
Calcium: 9.7 mg/dL (ref 8.9–10.3)
Chloride: 106 mmol/L (ref 98–111)
Creatinine, Ser: 1.16 mg/dL — ABNORMAL HIGH (ref 0.44–1.00)
GFR, Estimated: 51 mL/min — ABNORMAL LOW
Glucose, Bld: 138 mg/dL — ABNORMAL HIGH (ref 70–99)
Potassium: 4.6 mmol/L (ref 3.5–5.1)
Sodium: 140 mmol/L (ref 135–145)
Total Bilirubin: 0.5 mg/dL (ref 0.3–1.2)
Total Protein: 7.1 g/dL (ref 6.5–8.1)

## 2022-10-09 LAB — RESP PANEL BY RT-PCR (RSV, FLU A&B, COVID)  RVPGX2
Influenza A by PCR: NEGATIVE
Influenza B by PCR: NEGATIVE
Resp Syncytial Virus by PCR: NEGATIVE
SARS Coronavirus 2 by RT PCR: NEGATIVE

## 2022-10-09 LAB — TROPONIN I (HIGH SENSITIVITY): Troponin I (High Sensitivity): 4 ng/L (ref <18)

## 2022-10-09 MED ORDER — LACTATED RINGERS IV SOLN: INTRAVENOUS | Status: DC

## 2022-10-09 MED ORDER — HYDROXYZINE HCL 10 MG PO TABS
10.0000 mg | ORAL_TABLET | Freq: Three times a day (TID) | ORAL | Status: AC | PRN
  Administered 2022-10-09: 10 mg via ORAL
  Filled 2022-10-09: qty 1

## 2022-10-09 MED ORDER — ACETAMINOPHEN 325 MG PO TABS: 650.0000 mg | ORAL_TABLET | Freq: Four times a day (QID) | ORAL | Status: DC | PRN

## 2022-10-09 MED ORDER — ACETAMINOPHEN 650 MG RE SUPP: 650.0000 mg | Freq: Four times a day (QID) | RECTAL | Status: DC | PRN

## 2022-10-09 MED ORDER — LACTATED RINGERS IV BOLUS
500.0000 mL | Freq: Once | INTRAVENOUS | Status: AC
  Administered 2022-10-09: 500 mL via INTRAVENOUS

## 2022-10-09 MED ORDER — SODIUM CHLORIDE 0.9% FLUSH
3.0000 mL | Freq: Two times a day (BID) | INTRAVENOUS | Status: DC
  Administered 2022-10-09: 3 mL via INTRAVENOUS

## 2022-10-09 NOTE — ED Triage Notes (Signed)
Ems brings pt in from home for hypotension. Initial BP was 92/48. Pt states she has been feeling weak and dizzy.

## 2022-10-09 NOTE — H&P (Signed)
History and Physical    Patient: Sandra Mccoy:[id number] DOB: 1951/03/06 DOA: 10/09/2022 DOS: the patient was seen and examined on 10/09/2022 PCP: Antony Contras, MD  Patient coming from: Home  Chief Complaint:  Chief Complaint  Patient presents with   Hypotension   HPI:  72 year old PMH including diabetes, hypertension, hyperlipidemia presented to the emergency department after an episode of passing out.  Initial eval which was reassuring that she was placed in observation for evaluation of syncope.  Patient reports she has felt well lately, no illness.  She was changing her grandchild's diaper when she became lightheaded.  She then went to the bathroom and had a bowel movement and passed out.  She remembers gradually coming to lying on her bed.  She reports that she is eating well drinks fluids.  No changes in her medications.  No specific aggravating or alleviating symptoms.  She was beginning Quillian Quince fast today.  Further history from the husband, he went to check on her in the bathroom and found her unresponsive, on the commode lying slumped against the wall.  He was unable to get her off the commode and continue to try to arouse her.  She did wake up a bit although remained quite foggy.  When the fire truck arrived, fireman helped get her to the bed at which time she started to come around.  She felt quite groggy afterwards.  Husband reports no evidence of seizure-like activity.  He and family also reports she does not drink enough fluids.  She is feeling good health with no previous issues reported.  Review of Systems: No for fever, new visual changes, sore throat, rash, new muscle aches, chest pain, shortness of breath, dysuria, bleeding, nausea, vomiting, abdominal pain.  Past Medical History:  Diagnosis Date   Acute cholecystitis 01/01/2021   Diabetes mellitus without complication ([place])    Eczema    Hyperlipidemia    Hypertension    Past Surgical History:  Procedure Laterality  Date   CHOLECYSTECTOMY N/A 01/03/2021   Procedure: LAPAROSCOPIC CHOLECYSTECTOMY WITH INTRAOPERATIVE CHOLANGIOGRAM AND UMBLICIAL HERNIA REPAIR;  Surgeon: Armandina Gemma, MD;  Location: [facility];  Service: General;  Laterality: N/A;   Social History:  reports that she has never smoked. She has never used smokeless tobacco. She reports that she does not drink alcohol and does not use drugs.  Allergies  Allergen Reactions   Lipitor [Atorvastatin] Nausea And Vomiting and Other (See Comments)    Made her loopy   Simvastatin Other (See Comments)    Increased LFT's    Family History  Problem Relation Age of Onset   Hypertension Mother    Hypertension Father    Hypercalcemia Neg Hx     Prior to Admission medications   Medication Sig Start Date End Date Taking? Authorizing Provider  acetaminophen (TYLENOL) 325 MG tablet Take 2 tablets (650 mg total) by mouth every 6 (six) hours as needed for mild pain (or Fever >/= 101). 01/04/21   Meuth, Brooke A, PA-C  clobetasol ointment (TEMOVATE) 8.11 % Apply 1 application topically 2 (two) times daily as needed (eczema).    [provider]  diltiazem (CARDIZEM LA) 120 MG 24 hr tablet Take 1 tablet (120 mg total) by mouth daily. 07/15/21   Renato Shin, MD  ezetimibe (ZETIA) 10 MG tablet Take 10 mg by mouth every morning.    [provider]  furosemide (LASIX) 20 MG tablet Take 1 tablet (20 mg total) by mouth daily. 08/17/21   Loanne Drilling,  Gregary Signs, MD  GARLIC PO Take 1 tablet by mouth at bedtime.    [provider]  Ginger, Zingiber officinalis, (GINGER PO) Take 1 tablet by mouth at bedtime.    [provider]  levothyroxine (SYNTHROID) 88 MCG tablet Take 88 mcg by mouth every morning. 10/14/20   [provider]  loratadine (CLARITIN) 10 MG tablet Take 10 mg by mouth at bedtime as needed for allergies (congestion).    [provider]  Multiple Vitamin (MULTIVITAMIN WITH MINERALS) TABS tablet Take 1 tablet by mouth  every morning.    [provider]  olmesartan (BENICAR) 40 MG tablet Take 1 tablet (40 mg total) by mouth daily. 07/15/21   Romero Belling, MD  polyethylene glycol (MIRALAX / GLYCOLAX) 17 g packet Take 17 g by mouth daily as needed for mild constipation. 01/04/21   Franne Forts, PA-C    Physical Exam: Vitals:   10/09/22 1430 10/09/22 1530 10/09/22 1600 10/09/22 1700  BP: 133/74 (!) 152/70 (!) 144/84   Pulse: 70 65 97   Resp: 16 (!) 23 (!) 22   Temp:    97.6 F (36.4 C)  TempSrc:      SpO2: 100% 100% 100%    Physical Exam Vitals reviewed.  Constitutional:      General: She is not in acute distress.    Appearance: She is not ill-appearing or toxic-appearing.  Cardiovascular:     Rate and Rhythm: Normal rate and regular rhythm.     Heart sounds: No murmur heard. Pulmonary:     Effort: Pulmonary effort is normal. No respiratory distress.     Breath sounds: No wheezing, rhonchi or rales.  Abdominal:     Palpations: Abdomen is soft.  Musculoskeletal:     Right lower leg: No edema.     Left lower leg: No edema.     Comments: Strength and tone in bilateral upper and lower extremities grossly normal.  Neurological:     General: No focal deficit present.     Mental Status: She is alert.     Cranial Nerves: No cranial nerve deficit.     Motor: No weakness, abnormal muscle tone or pronator drift.     Coordination: Finger-Nose-Finger Test normal.  Psychiatric:        Mood and Affect: Mood normal.        Behavior: Behavior normal.     Data Reviewed: Creatinine mildly elevated at 1.16 with baseline of 1.03 GFR 51, possible CKD stage III AA Major CMP unremarkable Initial troponin negative WBC 12.1, CBC unremarkable COVID and influenza panel negative. CT head no acute abnormalities. Chest x-ray no acute disease independent read EKG sinus rhythm, no acute changes  Assessment and Plan: Syncope Highly suggestive of vasovagal in this overall healthy 72 year old woman who  by report does not drink enough fluids, was lightheaded and then had syncope after bowel.  EDP was concerned about prolonged altered consciousness, however this is most likely secondary to patient's inability to get horizontal secondary to physical contract in the bathroom. No history, signs or symptoms to suggest acute CNS process specifically stroke or seizure.  Her history does not suggest a cardiac event, EKG is nonacute, troponin is negative and she has no history of cardiac disease. Creatinine is slightly above normal. At this point check orthostatics, give some fluids, check echocardiogram and monitor on telemetry overnight.  If no further issues and workup unremarkable, likely  home tomorrow.  Essential hypertension Hold diltiazem, Lasix, Benicar  Prediabetes, diet  controlled No treatment indicated.  Follow-up as an outpatient.  Advance Care Planning: Full, confirmed with patient  Consults: none  Family Communication: husband, 2 daughters  Severity of Illness: The appropriate patient status for this patient is OBSERVATION. Observation status is judged to be reasonable and necessary in order to provide the required intensity of service to ensure the patient's safety. The patient's presenting symptoms, physical exam findings, and initial radiographic and laboratory data in the context of their medical condition is felt to place them at decreased risk for further clinical deterioration. Furthermore, it is anticipated that the patient will be medically stable for discharge from the hospital within 2 midnights of admission.   Author: Brendia Sacks, MD 10/09/2022 5:33 PM  For on call review [email or web address].

## 2022-10-09 NOTE — ED Provider Notes (Signed)
[facility]-EMERGENCY DEPT Provider Note   CSN: [id number] Arrival date & time: 10/09/22  1334     History {Add pertinent medical, surgical, social history, OB history to HPI:1} Chief Complaint  Patient presents with   Hypotension    Sandra Mccoy is a 72 y.o. female.  HPI 72 yo female with episode of unresponsiveness, patient had urge to have bm and went to bathroom.  Patient felt ok this am and took care of gb.  He went into the bathroom to have a bowel movement.  Her daughter was calling her to come help with the baby she did not respond.  Her husband went in and found her slumped against the wall and unresponsive.  Was unable to get her to the ground.  He states she was making some gurgling sounds.  She was unresponsive for several minutes and 911 was called.  She began to wake up prior to EMS arriving.  Daughter reports that her first blood pressure was 70.  She states that she feels much better now that she received a fluid bolus.  EMS run [facility] notes that she received 500 Eldin mL prior to arrival.  Patient reports that she has not had similar symptoms in the past.  She been doing well prior to this acute episode.  She now has no complaints. Dr. Erling Conte Daughter repots first bp 70 IV fluids given 500 ml and patient improved     Home Medications Prior to Admission medications   Medication Sig Start Date End Date Taking? Authorizing Provider  acetaminophen (TYLENOL) 325 MG tablet Take 2 tablets (650 mg total) by mouth every 6 (six) hours as needed for mild pain (or Fever >/= 101). 01/04/21   Meuth, Brooke A, PA-C  clobetasol ointment (TEMOVATE) 0.05 % Apply 1 application topically 2 (two) times daily as needed (eczema).    [provider]  diltiazem (CARDIZEM LA) 120 MG 24 hr tablet Take 1 tablet (120 mg total) by mouth daily. 07/15/21   Romero Belling, MD  ezetimibe (ZETIA) 10 MG tablet Take 10 mg by mouth every morning.    [provider]   furosemide (LASIX) 20 MG tablet Take 1 tablet (20 mg total) by mouth daily. 08/17/21   Romero Belling, MD  GARLIC PO Take 1 tablet by mouth at bedtime.    [provider]  Ginger, Zingiber officinalis, (GINGER PO) Take 1 tablet by mouth at bedtime.    [provider]  levothyroxine (SYNTHROID) 88 MCG tablet Take 88 mcg by mouth every morning. 10/14/20   [provider]  loratadine (CLARITIN) 10 MG tablet Take 10 mg by mouth at bedtime as needed for allergies (congestion).    [provider]  Multiple Vitamin (MULTIVITAMIN WITH MINERALS) TABS tablet Take 1 tablet by mouth every morning.    [provider]  olmesartan (BENICAR) 40 MG tablet Take 1 tablet (40 mg total) by mouth daily. 07/15/21   Romero Belling, MD  polyethylene glycol (MIRALAX / GLYCOLAX) 17 g packet Take 17 g by mouth daily as needed for mild constipation. 01/04/21   Meuth, Brooke A, PA-C      Allergies    Lipitor [atorvastatin] and Simvastatin    Review of Systems   Review of Systems  Physical Exam Updated Vital Signs BP (!) 123/54   Pulse 70   Temp 97.6 F (36.4 C) (Oral)   Resp 18   SpO2 97%  Physical Exam Vitals and nursing note reviewed.  HENT:  Head: Normocephalic.     Right Ear: External ear normal.     Left Ear: External ear normal.     Nose: Nose normal.     Mouth/Throat:     Pharynx: Oropharynx is clear.     Comments: No evidence of tongue biting or other intraoral trauma Eyes:     Extraocular Movements: Extraocular movements intact.     Pupils: Pupils are equal, round, and reactive to light.  Cardiovascular:     Rate and Rhythm: Normal rate and regular rhythm.     Pulses: Normal pulses.  Pulmonary:     Effort: Pulmonary effort is normal.     Breath sounds: Normal breath sounds.  Abdominal:     General: Abdomen is flat. Bowel sounds are normal.     Palpations: Abdomen is soft.  Musculoskeletal:        General: Normal range of motion.     Cervical  back: Normal range of motion.  Skin:    General: Skin is warm and dry.     Capillary Refill: Capillary refill takes less than 2 seconds.  Neurological:     Mental Status: She is alert.  Psychiatric:        Mood and Affect: Mood normal.     ED Results / Procedures / Treatments   Labs (all labs ordered are listed, but only abnormal results are displayed) Labs Reviewed - No data to display  EKG None  Radiology No results found.  Procedures Procedures  {Document cardiac monitor, telemetry assessment procedure when appropriate:1}  Medications Ordered in ED Medications - No data to display  ED Course/ Medical Decision Making/ A&P Clinical Course as of 10/09/22 1623  Mon Oct 09, 2022  1607 EKG reviewed interpreted no evidence of acute abnormality is noted [DR]  1607 Complete blood cell count is reviewed and interpreted significant for mild leukocytosis otherwise within normal limits [DR]  1607 Complete metabolic panel is reviewed and interpreted significant for mild hyperglycemia with glucose 138 [DR]  1608 Creatinine is slightly elevated at 1.16 [DR]  1608 First troponin is reviewed and is normal [DR]    Clinical Course User Index [DR] Margarita Grizzle, MD                           Medical Decision Making Amount and/or Complexity of Data Reviewed Labs: ordered. Radiology: ordered.   This patient presents to the ED for concern of episode of unresponsiveness and hypotension, this involves an extensive number of treatment options, and is a complaint that carries with it a high risk of complications and morbidity.  The differential diagnosis includes differential diagnosis is broad and includes but is not limited to 1 cardiac syncope-including arrhythmia and coronary artery disease 2 seizure 3 hypoglycemia and other metabolic abnormalities Sepsis   Co morbidities that complicate the patient evaluation - Hypothyroidism  hyperlipidemia, type 2 diabetes   Additional  history obtained:  Additional history obtained from history with husband via FaceTime while in room    Lab Tests:  I Ordered, and personally interpreted labs.  The pertinent results include: CBC returned and mild leukocytosis with no evidence of anemia Metabolic panel returned and creatinine slightly elevated at 1.16 which is up from her baseline but no evidence of hypoglycemia or other acute metabolic derangement. Troponin is 4 on initial evaluation   Imaging Studies ordered:  I ordered imaging studies including ***  I independently visualized and interpreted imaging which showed *** I  agree with the radiologist interpretation   Cardiac Monitoring: / EKG:  The patient was maintained on a cardiac monitor.  I personally viewed and interpreted the cardiac monitored which showed an underlying rhythm of: ***   Consultations Obtained:  I requested consultation with the ***,  and discussed lab and imaging findings as well as pertinent plan - they recommend: ***   Problem List / ED Course / Critical interventions / Medication management  *** I ordered medication including ***  for ***  Reevaluation of the patient after these medicines showed that the patient {resolved/improved/worsened:23923::"improved"} I have reviewed the patients home medicines and have made adjustments as needed   Social Determinants of Health:  ***   Test / Admission - Considered:  ***   {Document critical care time when appropriate:1} {Document review of labs and clinical decision tools ie heart score, Chads2Vasc2 etc:1}  {Document your independent review of radiology images, and any outside records:1} {Document your discussion with family members, caretakers, and with consultants:1} {Document social determinants of health affecting pt's care:1} {Document your decision making why or why not admission, treatments were needed:1} Final Clinical Impression(s) / ED Diagnoses Final diagnoses:  None     Rx / DC Orders ED Discharge Orders     None

## 2022-10-09 NOTE — Hospital Course (Signed)
Ems brings pt in from home for hypotension. Initial BP was 92/48. Pt states she has been feeling weak and dizzy. 

## 2022-10-10 ENCOUNTER — Observation Stay (HOSPITAL_BASED_OUTPATIENT_CLINIC_OR_DEPARTMENT_OTHER): Payer: Medicare Other

## 2022-10-10 ENCOUNTER — Other Ambulatory Visit: Payer: Self-pay

## 2022-10-10 DIAGNOSIS — R55 Syncope and collapse: Secondary | ICD-10-CM

## 2022-10-10 LAB — CBC
HCT: 44.5 % (ref 36.0–46.0)
Hemoglobin: 14.3 g/dL (ref 12.0–15.0)
MCH: 31.4 pg (ref 26.0–34.0)
MCHC: 32.1 g/dL (ref 30.0–36.0)
MCV: 97.6 fL (ref 80.0–100.0)
Platelets: 209 10*3/uL (ref 150–400)
RBC: 4.56 MIL/uL (ref 3.87–5.11)
RDW: 13.1 % (ref 11.5–15.5)
WBC: 10.5 10*3/uL (ref 4.0–10.5)
nRBC: 0 % (ref 0.0–0.2)

## 2022-10-10 LAB — BASIC METABOLIC PANEL
Anion gap: 10 (ref 5–15)
BUN: 12 mg/dL (ref 8–23)
CO2: 24 mmol/L (ref 22–32)
Calcium: 10.4 mg/dL — ABNORMAL HIGH (ref 8.9–10.3)
Chloride: 104 mmol/L (ref 98–111)
Creatinine, Ser: 0.87 mg/dL (ref 0.44–1.00)
GFR, Estimated: >60 mL/min (ref >60)
Glucose, Bld: 101 mg/dL — ABNORMAL HIGH (ref 70–99)
Potassium: 4 mmol/L (ref 3.5–5.1)
Sodium: 138 mmol/L (ref 135–145)

## 2022-10-10 LAB — ECHOCARDIOGRAM COMPLETE
Area-P 1/2: 3.37 cm2
Calc EF: 69.6 %
S' Lateral: 2.3 cm
Single Plane A2C EF: 71.5 %
Single Plane A4C EF: 68.8 %
Weight: 2437.41 oz

## 2022-10-10 LAB — GLUCOSE, CAPILLARY: Glucose-Capillary: 102 mg/dL — ABNORMAL HIGH (ref 70–99)

## 2022-10-10 MED ORDER — HYDROXYZINE HCL 10 MG PO TABS: 10.0000 mg | ORAL_TABLET | Freq: Three times a day (TID) | ORAL | 0 refills | Status: AC | PRN

## 2022-10-10 NOTE — TOC Transition Note (Signed)
Transition of Care [facility]) - CM/SW Discharge Note   Patient Details  Name: Sandra Mccoy MRN: [id number] Date of Birth: [birth year]  [facility] CM/SW Contact:  Leeroy Cha, RN Phone Number: 10/10/2022, 12:18 PM   Clinical Narrative:    Patient discharged to return home with self care.  No toc needs present.   Final next level of care: Home/Self Care Barriers to Discharge: Barriers Resolved   Patient Goals and CMS Choice CMS Medicare.gov Compare Post Acute Care list provided to:: Patient    Discharge Placement                         Discharge Plan and Services Additional resources added to the After Visit Summary for     Discharge Planning Services: CM Consult                                 Social Determinants of Health (SDOH) Interventions SDOH Screenings   Food Insecurity: No Food Insecurity (10/10/2022)  Housing: Low Risk  (10/10/2022)  Transportation Needs: No Transportation Needs (10/10/2022)  Utilities: Not At Risk (10/10/2022)  Tobacco Use: Low Risk  (10/09/2022)     Readmission Risk Interventions   No data to display

## 2022-10-10 NOTE — Progress Notes (Signed)
Echocardiogram 2D Echocardiogram has been performed.  Sandra Mccoy 10/10/2022, 8:37 AM

## 2022-10-10 NOTE — TOC Initial Note (Signed)
Transition of Care [facility]) - Initial/Assessment Note    Patient Details  Name: Sandra Mccoy MRN: [id number] Date of Birth: [birth year]  Transition of Care [facility]) CM/SW Contact:    Golda Acre, RN Phone Number: 10/10/2022, 8:13 AM  Clinical Narrative:                  Transition of Care [facility]) Screening Note   Patient Details  Name: Sandra Mccoy Date of Birth: May 21, 1951   Transition of Care [facility]) CM/SW Contact:    Golda Acre, RN Phone Number: 10/10/2022, 8:13 AM    Transition of Care Department [facility]) has reviewed patient and no TOC needs have been identified at this time. We will continue to monitor patient advancement through interdisciplinary progression rounds. If new patient transition needs arise, please place a TOC consult.    Expected Discharge Plan: Home/Self Care Barriers to Discharge: Continued Medical Work up   Patient Goals and CMS Choice Patient states their goals for this hospitalization and ongoing recovery are:: to go back home and be well CMS Medicare.gov Compare Post Acute Care list provided to:: Patient        Expected Discharge Plan and Services   Discharge Planning Services: CM Consult   Living arrangements for the past 2 months: Single Family Home                                      Prior Living Arrangements/Services Living arrangements for the past 2 months: Single Family Home Lives with:: Spouse Patient language and need for interpreter reviewed:: Yes Do you feel safe going back to the place where you live?: Yes            Criminal Activity/Legal Involvement Pertinent to Current Situation/Hospitalization: No - Comment as needed  Activities of Daily Living Home Assistive Devices/Equipment: None ADL Screening (condition at time of admission) Patient's cognitive ability adequate to safely complete daily activities?: Yes Is the patient deaf or have difficulty hearing?: No Does the patient have difficulty seeing, even  when wearing glasses/contacts?: No Does the patient have difficulty concentrating, remembering, or making decisions?: No Patient able to express need for assistance with ADLs?: Yes Does the patient have difficulty dressing or bathing?: No Independently performs ADLs?: Yes (appropriate for developmental age) Does the patient have difficulty walking or climbing stairs?: No Weakness of Legs: None Weakness of Arms/Hands: None  Permission Sought/Granted                  Emotional Assessment Appearance:: Appears stated age Attitude/Demeanor/Rapport: Engaged Affect (typically observed): Appropriate Orientation: : Oriented to Self, Oriented to Place, Oriented to  Time, Oriented to Situation Alcohol / Substance Use: Never Used Psych Involvement: No (comment)  Admission diagnosis:  Syncope [R55] Patient Active Problem List   Diagnosis Date Noted   Syncope 10/09/2022   Cholelithiasis 01/02/2021   Acute cholecystitis 01/01/2021   Hypercalcemia 01/01/2021   Hypothyroidism 01/01/2021   Upper respiratory tract infection 07/13/2017   Sore throat 07/13/2017   Cough 07/13/2017   Essential hypertension 07/13/2017   Hyperlipidemia 07/13/2017   Type 2 diabetes mellitus without complication, without long-term current use of insulin (HCC) 07/13/2017   PCP:  [name], MD Pharmacy:   [id number] - [place], [place] - [address] [place]  [address] Phone: [phone] Fax: [phone]  [facility]) - [place], [place] - [address] [place] [address] Phone: [phone] Fax: [phone]     Social Determinants of Health (SDOH) Social History: [place]: No Food Insecurity (10/10/2022)  Housing: Low Risk  (10/10/2022)  Transportation Needs: No Transportation Needs (10/10/2022)  Utilities: Not At Risk (10/10/2022)  Tobacco Use: Low Risk  (10/09/2022)   SDOH  Interventions:     Readmission Risk Interventions   No data to display

## 2022-10-10 NOTE — Discharge Summary (Signed)
Physician Discharge Summary   Patient: Sandra Mccoy MRN: [id number] DOB: 1951-05-03  Admit date:     10/09/2022  Discharge date: 10/10/22  Discharge Physician: Brendia Sacks   PCP: [name], MD   Recommendations at discharge:  Syncope   Discharge Diagnoses: Principal Problem:   Syncope Active Problems:   Essential hypertension  Resolved Problems:   * No resolved hospital problems. *  Hospital Course: 72 year old PMH including diabetes, hypertension, hyperlipidemia presented to the emergency department after an episode of passing out. Initial eval was reassuring and she was placed in observation for evaluation of syncope.  History most suggestive of vasovagal syncope.  Echocardiogram restriction and workup was unrevealing.  Telemetry showed sinus bradycardia.  Hospitalization uncomplicated.  Syncope Highly suggestive of vasovagal in this overall healthy 72 year old woman who by report does not drink enough fluids, was lightheaded and then had syncope after bowel.  EDP was concerned about prolonged altered consciousness, however this is most likely secondary to patient's inability to get horizontal secondary to physical contract in the bathroom. No history, signs or symptoms to suggest acute CNS process specifically stroke or seizure.  Her history does not suggest a cardiac event, EKG is nonacute, troponin is negative and she has no history of cardiac disease. Creatinine is slightly above normal. Orthostatic by pulse.  Encouraged to drink fluids.  Slow position changes.   Essential hypertension Can resume diltiazem, Lasix, Benicar   Prediabetes, diet controlled No treatment indicated.  Follow-up as an outpatient.  Consultants:  None  Procedures performed:  None   Disposition: Home Diet recommendation:  Discharge Diet Orders (From admission, onward)     Start     Ordered   10/10/22 0000  Diet general        10/10/22 1206           Regular diet DISCHARGE  MEDICATION: Allergies as of 10/10/2022       Reactions   Lipitor [atorvastatin] Nausea And Vomiting, Other (See Comments)   Made her loopy   Simvastatin Other (See Comments)   Increased LFT's        Medication List     TAKE these medications    acetaminophen 325 MG tablet Commonly known as: TYLENOL Take 2 tablets (650 mg total) by mouth every 6 (six) hours as needed for mild pain (or Fever >/= 101).   clobetasol ointment 0.05 % Commonly known as: TEMOVATE Apply 1 application topically 2 (two) times daily as needed (eczema).   diltiazem 120 MG 24 hr tablet Commonly known as: CARDIZEM LA Take 1 tablet (120 mg total) by mouth daily.   ezetimibe 10 MG tablet Commonly known as: ZETIA Take 10 mg by mouth every morning.   furosemide 20 MG tablet Commonly known as: LASIX Take 1 tablet (20 mg total) by mouth daily.   GARLIC PO Take 1 tablet by mouth at bedtime.   GINGER PO Take 1 tablet by mouth at bedtime.   hydrOXYzine 10 MG tablet Commonly known as: ATARAX Take 1 tablet (10 mg total) by mouth 3 (three) times daily as needed for itching.   levothyroxine 88 MCG tablet Commonly known as: SYNTHROID Take 88 mcg by mouth every morning.   loratadine 10 MG tablet Commonly known as: CLARITIN Take 10 mg by mouth at bedtime as needed for allergies (congestion).   multivitamin with minerals Tabs tablet Take 1 tablet by mouth every morning.   olmesartan 40 MG tablet Commonly known as: BENICAR Take 1 tablet (40 mg total) by  mouth daily.   polyethylene glycol 17 g packet Commonly known as: MIRALAX / GLYCOLAX Take 17 g by mouth daily as needed for mild constipation.        Follow-up Information     Antony Contras, MD. Schedule an appointment as soon as possible for a visit.   Specialty: Family Medicine Why: As needed Contact information: [address], [address]  [place] [address] [phone]                Feels good No dizziness  Discharge  Exam: Filed Weights   10/10/22 0500  Weight: 69.1 kg   Physical Exam Vitals reviewed.  Constitutional:      General: She is not in acute distress.    Appearance: She is not ill-appearing or toxic-appearing.  Cardiovascular:     Rate and Rhythm: Normal rate and regular rhythm.     Heart sounds: No murmur heard. Pulmonary:     Effort: Pulmonary effort is normal. No respiratory distress.     Breath sounds: No wheezing, rhonchi or rales.  Neurological:     Mental Status: She is alert.  Psychiatric:        Mood and Affect: Mood normal.        Behavior: Behavior normal.    Condition at discharge: good  The results of significant diagnostics from this hospitalization (including imaging, microbiology, ancillary and laboratory) are listed below for reference.   Imaging Studies: ECHOCARDIOGRAM COMPLETE  Result Date: 10/10/2022    ECHOCARDIOGRAM REPORT   Patient Name:   Sandra Mccoy Date of Exam: 10/10/2022 Medical Rec #:  [id number]    Height:       63.0 in Accession #:    [id number]   Weight:       152.3 lb Date of Birth:  22-Nov-1950     BSA:          1.722 m Patient Age:    86 years     BP:           142/82 mmHg Patient Gender: F            HR:           63 bpm. Exam Location:  Inpatient Procedure: 2D Echo, Cardiac Doppler and Color Doppler Indications:    R55 Syncope  History:        Patient has no prior history of Echocardiogram examinations.                 Signs/Symptoms:Syncope; Risk Factors:Diabetes, Hypertension and                 Dyslipidemia.  Sonographer:    Bernadene Person RDCS Referring Phys: [place]  1. Left ventricular ejection fraction, by estimation, is 65 to 70%. The left ventricle has normal function. The left ventricle has no regional wall motion abnormalities. Left ventricular diastolic parameters were normal.  2. Right ventricular systolic function is normal. The right ventricular size is normal. There is normal pulmonary artery systolic pressure. The  estimated right ventricular systolic pressure is 26.8 mmHg.  3. The mitral valve is grossly normal. Mild mitral valve regurgitation. No evidence of mitral stenosis.  4. The aortic valve is tricuspid. There is mild calcification of the aortic valve. Aortic valve regurgitation is not visualized. Aortic valve sclerosis is present, with no evidence of aortic valve stenosis.  5. The inferior vena cava is normal in size with greater than 50% respiratory variability, suggesting right atrial pressure  of 3 mmHg. FINDINGS  Left Ventricle: Left ventricular ejection fraction, by estimation, is 65 to 70%. The left ventricle has normal function. The left ventricle has no regional wall motion abnormalities. The left ventricular internal cavity size was normal in size. There is  no left ventricular hypertrophy. Left ventricular diastolic parameters were normal. Right Ventricle: The right ventricular size is normal. No increase in right ventricular wall thickness. Right ventricular systolic function is normal. There is normal pulmonary artery systolic pressure. The tricuspid regurgitant velocity is 2.38 m/s, and  with an assumed right atrial pressure of 3 mmHg, the estimated right ventricular systolic pressure is 25.7 mmHg. Left Atrium: Left atrial size was normal in size. Right Atrium: Right atrial size was normal in size. Pericardium: Trivial pericardial effusion is present. Presence of epicardial fat layer. Mitral Valve: The mitral valve is grossly normal. Mild mitral valve regurgitation. No evidence of mitral valve stenosis. Tricuspid Valve: The tricuspid valve is grossly normal. Tricuspid valve regurgitation is mild . No evidence of tricuspid stenosis. Aortic Valve: The aortic valve is tricuspid. There is mild calcification of the aortic valve. Aortic valve regurgitation is not visualized. Aortic valve sclerosis is present, with no evidence of aortic valve stenosis. Pulmonic Valve: The pulmonic valve was grossly normal.  Pulmonic valve regurgitation is not visualized. No evidence of pulmonic stenosis. Aorta: The aortic root and ascending aorta are structurally normal, with no evidence of dilitation. Venous: The inferior vena cava is normal in size with greater than 50% respiratory variability, suggesting right atrial pressure of 3 mmHg. IAS/Shunts: The atrial septum is grossly normal.  LEFT VENTRICLE PLAX 2D LVIDd:         4.40 cm     Diastology LVIDs:         2.30 cm     LV e' medial:    8.76 cm/s LV PW:         0.80 cm     LV E/e' medial:  10.8 LV IVS:        0.80 cm     LV e' lateral:   9.90 cm/s LVOT diam:     1.90 cm     LV E/e' lateral: 9.5 LV SV:         58 LV SV Index:   33 LVOT Area:     2.84 cm  LV Volumes (MOD) LV vol d, MOD A2C: 85.6 ml LV vol d, MOD A4C: 69.8 ml LV vol s, MOD A2C: 24.4 ml LV vol s, MOD A4C: 21.8 ml LV SV MOD A2C:     61.2 ml LV SV MOD A4C:     69.8 ml LV SV MOD BP:      55.8 ml RIGHT VENTRICLE RV S prime:     12.60 cm/s TAPSE (M-mode): 2.1 cm LEFT ATRIUM             Index        RIGHT ATRIUM           Index LA diam:        3.50 cm 2.03 cm/m   RA Area:     16.40 cm LA Vol (A2C):   42.0 ml 24.38 ml/m  RA Volume:   46.30 ml  26.88 ml/m LA Vol (A4C):   49.0 ml 28.45 ml/m LA Biplane Vol: 45.4 ml 26.36 ml/m  AORTIC VALVE LVOT Vmax:   92.00 cm/s LVOT Vmean:  60.500 cm/s LVOT VTI:    0.203 m  AORTA Ao Root diam: 3.00 cm  Ao Asc diam:  3.30 cm MITRAL VALVE               TRICUSPID VALVE MV Area (PHT): 3.37 cm    TR Peak grad:   22.7 mmHg MV Decel Time: 225 msec    TR Vmax:        238.00 cm/s MV E velocity: 94.30 cm/s MV A velocity: 49.30 cm/s  SHUNTS MV E/A ratio:  1.91        Systemic VTI:  0.20 m                            Systemic Diam: 1.90 cm Lennie Odor MD Electronically signed by Lennie Odor MD Signature Date/Time: 10/10/2022/9:22:24 AM    Final    CT Head Wo Contrast  Result Date: 10/09/2022 CLINICAL DATA:  Seizure, new-onset, no history of trauma EXAM: CT HEAD WITHOUT CONTRAST TECHNIQUE:  Contiguous axial images were obtained from the base of the skull through the vertex without intravenous contrast. RADIATION DOSE REDUCTION: This exam was performed according to the departmental dose-optimization program which includes automated exposure control, adjustment of the mA and/or kV according to patient size and/or use of iterative reconstruction technique. COMPARISON:  None Available. FINDINGS: Brain: No evidence of acute infarction, hemorrhage, hydrocephalus, extra-axial collection or mass lesion/mass effect. Vascular: No hyperdense vessel or unexpected calcification. Skull: Normal. Negative for fracture or focal lesion. Sinuses/Orbits: No acute finding. Other: None. IMPRESSION: No acute intracranial abnormality. Electronically Signed   By: Duanne Guess D.O.   On: 10/09/2022 16:18   DG Chest Port 1 View  Result Date: 10/09/2022 CLINICAL DATA:  Syncope.  Hypotension. EXAM: PORTABLE CHEST 1 VIEW COMPARISON:  02/08/2021. FINDINGS: Cardiac silhouette normal in size. Normal mediastinal and hilar contours. Clear lungs.  No convincing pleural effusion.  No pneumothorax. Skeletal structures are grossly intact. IMPRESSION: No active disease. Electronically Signed   By: Amie Portland M.D.   On: 10/09/2022 15:12    Microbiology: Results for orders placed or performed during the hospital encounter of 10/09/22  Resp panel by RT-PCR (RSV, Flu A&B, Covid) Anterior Nasal Swab     Status: None   Collection Time: 10/09/22  4:17 PM   Specimen: Anterior Nasal Swab  Result Value Ref Range Status   SARS Coronavirus 2 by RT PCR NEGATIVE NEGATIVE Final    Comment: (NOTE) SARS-CoV-2 target nucleic acids are NOT DETECTED.  The SARS-CoV-2 RNA is generally detectable in upper respiratory specimens during the acute phase of infection. The lowest concentration of SARS-CoV-2 viral copies this assay can detect is 138 copies/mL. A negative result does not preclude SARS-Cov-2 infection and should not be used as the  sole basis for treatment or other patient management decisions. A negative result may occur with  improper specimen collection/handling, submission of specimen other than nasopharyngeal swab, presence of viral mutation(s) within the areas targeted by this assay, and inadequate number of viral copies(<138 copies/mL). A negative result must be combined with clinical observations, patient history, and epidemiological information. The expected result is Negative.  Fact Sheet for Patients:  BloggerCourse.com  Fact Sheet for Healthcare Providers:  SeriousBroker.it  This test is no t yet approved or cleared by the [place] FDA and  has been authorized for detection and/or diagnosis of SARS-CoV-2 by FDA under an Emergency Use Authorization (EUA). This EUA will remain  in effect (meaning this test can be used) for the duration of the COVID-19 declaration under Section 564(b)(1)  of the Act, 21 U.S.C.section 360bbb-3(b)(1), unless the authorization is terminated  or revoked sooner.       Influenza A by PCR NEGATIVE NEGATIVE Final   Influenza B by PCR NEGATIVE NEGATIVE Final    Comment: (NOTE) The Xpert Xpress SARS-CoV-2/FLU/RSV plus assay is intended as an aid in the diagnosis of influenza from Nasopharyngeal swab specimens and should not be used as a sole basis for treatment. Nasal washings and aspirates are unacceptable for Xpert Xpress SARS-CoV-2/FLU/RSV testing.  Fact Sheet for Patients: BloggerCourse.com  Fact Sheet for Healthcare Providers: SeriousBroker.it  This test is not yet approved or cleared by the [place] FDA and has been authorized for detection and/or diagnosis of SARS-CoV-2 by FDA under an Emergency Use Authorization (EUA). This EUA will remain in effect (meaning this test can be used) for the duration of the COVID-19 declaration under Section 564(b)(1) of the  Act, 21 U.S.C. section 360bbb-3(b)(1), unless the authorization is terminated or revoked.     Resp Syncytial Virus by PCR NEGATIVE NEGATIVE Final    Comment: (NOTE) Fact Sheet for Patients: BloggerCourse.com  Fact Sheet for Healthcare Providers: SeriousBroker.it  This test is not yet approved or cleared by the [place] FDA and has been authorized for detection and/or diagnosis of SARS-CoV-2 by FDA under an Emergency Use Authorization (EUA). This EUA will remain in effect (meaning this test can be used) for the duration of the COVID-19 declaration under Section 564(b)(1) of the Act, 21 U.S.C. section 360bbb-3(b)(1), unless the authorization is terminated or revoked.  Performed at [facility], [address], [place], [place] [address]     Labs: CBC: Recent Labs  Lab 10/09/22 1509 10/10/22 0536  WBC 12.1* 10.5  HGB 12.5 14.3  HCT 38.2 44.5  MCV 96.2 97.6  PLT 206 209   Basic Metabolic Panel: Recent Labs  Lab 10/09/22 1509 10/10/22 0536  NA 140 138  K 4.6 4.0  CL 106 104  CO2 27 24  GLUCOSE 138* 101*  BUN 17 12  CREATININE 1.16* 0.87  CALCIUM 9.7 10.4*   Liver Function Tests: Recent Labs  Lab 10/09/22 1509  AST 21  ALT 21  ALKPHOS 63  BILITOT 0.5  PROT 7.1  ALBUMIN 3.4*   CBG: Recent Labs  Lab 10/10/22 0534  GLUCAP 102*    Discharge time spent: less than 30 minutes.  Signed: Brendia Sacks, MD [facility] 10/10/2022

## 2022-12-13 ENCOUNTER — Ambulatory Visit (INDEPENDENT_AMBULATORY_CARE_PROVIDER_SITE_OTHER): Payer: Medicare Other | Admitting: Allergy

## 2022-12-13 ENCOUNTER — Other Ambulatory Visit: Payer: Self-pay

## 2022-12-13 ENCOUNTER — Encounter: Payer: Self-pay | Admitting: Allergy

## 2022-12-13 VITALS — BP 122/68 | HR 74 | Temp 98.0°F | Resp 16 | Ht 64.5 in | Wt 156.9 lb

## 2022-12-13 DIAGNOSIS — H1013 Acute atopic conjunctivitis, bilateral: Secondary | ICD-10-CM | POA: Diagnosis not present

## 2022-12-13 DIAGNOSIS — J310 Chronic rhinitis: Secondary | ICD-10-CM

## 2022-12-13 MED ORDER — FLUTICASONE PROPIONATE 50 MCG/ACT NA SUSP: 2.0000 | Freq: Two times a day (BID) | NASAL | 5 refills | Status: DC | PRN

## 2022-12-13 MED ORDER — LEVOCETIRIZINE DIHYDROCHLORIDE 5 MG PO TABS: 5.0000 mg | ORAL_TABLET | Freq: Every evening | ORAL | 5 refills | Status: DC

## 2022-12-13 MED ORDER — OLOPATADINE HCL 0.2 % OP SOLN: 1.0000 [drp] | Freq: Every day | OPHTHALMIC | 5 refills | Status: DC | PRN

## 2022-12-13 MED ORDER — AZELASTINE HCL 0.15 % NA SOLN: NASAL | 5 refills | Status: DC

## 2022-12-13 NOTE — Patient Instructions (Addendum)
-   Return next Wednesday, 12/20/22 at [place] for skin testing.   Remember to hold all antihistamines for 3 days prior to this visit - Start taking: Xyzal (levocetirizine) 5mg  tablet once daily.  Samples provided.  Flonase (fluticasone) two sprays per nostril daily for 1-2 weeks at a time before stopping once nasal congestion improves for maximum benefit. Astepro (azelastine) 2 sprays per nostril 1-2 times daily as needed for drainage. When using nasal sprays - AIM FOR EAR ON EACH SIDE Pataday (olopatadine) one drop per eye daily as needed for itchy/watery eyes.  - You can use an extra dose of the antihistamine, if needed, for breakthrough symptoms.  - Consider nasal saline rinses 1-2 times daily to remove allergens from the nasal cavities as well as help with mucous clearance (this is especially helpful to do before the nasal sprays are given)  Follow-up next week for skin testing

## 2022-12-13 NOTE — Progress Notes (Signed)
New Patient Note  RE: Sandra Mccoy MRN: [id number] DOB: [birth year] Date of Office Visit: 12/13/2022   Primary care provider: Antony Contras, MD  Chief Complaint: Allergies  History of present illness: Sandra Mccoy is a 72 y.o. female presenting today for evaluation of allergic rhinitis.  She reports symptoms of watery eyes, itchy eyes, sneezing, throat clearing, runny and stuffy nose. Symptoms are year-round but are worse in spring.  She has taken claritin in the past.  She has noted that the antihistamines does seem to work less as she continues to take it.  She has tried OTC eye drops which she states did help.  She has used nasal sprays a long time ago.   She has history of eczema and states she had a major flare up last year.  She does have clobetasol to use as needed.  She does see a dermatologist for this.  She does have at least another ointment prescribed by dermatology at home to use when needed but does not recall the name.   No history of asthma or food allergy.    Review of systems: Review of Systems  Constitutional: Negative.   HENT:         See HPI  Eyes: Negative.   Respiratory: Negative.    Cardiovascular: Negative.   Gastrointestinal: Negative.   Musculoskeletal: Negative.   Skin: Negative.   Allergic/Immunologic: Negative.   Neurological: Negative.     All other systems negative unless noted above in HPI  Past medical history: Past Medical History:  Diagnosis Date   Acute cholecystitis 01/01/2021   Diabetes mellitus without complication ([place])    Eczema    Hyperlipidemia    Hypertension     Past surgical history: Past Surgical History:  Procedure Laterality Date   CHOLECYSTECTOMY N/A 01/03/2021   Procedure: LAPAROSCOPIC CHOLECYSTECTOMY WITH INTRAOPERATIVE CHOLANGIOGRAM AND UMBLICIAL HERNIA REPAIR;  Surgeon: Armandina Gemma, MD;  Location: [facility];  Service: General;  Laterality: N/A;    Family history:  Family History  Problem Relation Age of Onset    Hypertension Mother    Hypertension Father    Asthma Sister    Hypercalcemia Neg Hx     Social history: Lives in a home with carpeting in the bedroom with electric heating and central cooling.  No pets in the home.  There is no concern for water damage, mildew or roaches in the home.  He is retired.  She has no smoking history.   Medication List: Current Outpatient Medications  Medication Sig Dispense Refill   acetaminophen (TYLENOL) 325 MG tablet Take 2 tablets (650 mg total) by mouth every 6 (six) hours as needed for mild pain (or Fever >/= 101).     Azelastine HCl 0.15 % SOLN 2 sprays per nostril 1-2 times daily as needed for drainage. 30 mL 5   clobetasol ointment (TEMOVATE) [id number] % Apply 1 application topically 2 (two) times daily as needed (eczema).     diltiazem (CARDIZEM LA) 120 MG 24 hr tablet Take 1 tablet (120 mg total) by mouth daily. 90 tablet 3   ezetimibe (ZETIA) 10 MG tablet Take 10 mg by mouth every morning.     fluticasone (FLONASE) 50 MCG/ACT nasal spray Place 2 sprays into both nostrils 2 (two) times daily as needed for allergies or rhinitis. 16 g 5   furosemide (LASIX) 20 MG tablet Take 1 tablet (20 mg total) by mouth daily. 90 tablet 3   GARLIC PO Take 1 tablet by  mouth at bedtime.     Ginger, Zingiber officinalis, (GINGER PO) Take 1 tablet by mouth at bedtime.     hydrOXYzine (ATARAX) 10 MG tablet Take 1 tablet (10 mg total) by mouth 3 (three) times daily as needed for itching. 30 tablet 0   levocetirizine (XYZAL) 5 MG tablet Take 1 tablet (5 mg total) by mouth every evening. 30 tablet 5   levothyroxine (SYNTHROID) 88 MCG tablet Take 88 mcg by mouth every morning.     loratadine (CLARITIN) 10 MG tablet Take 10 mg by mouth at bedtime as needed for allergies (congestion).     Multiple Vitamin (MULTIVITAMIN WITH MINERALS) TABS tablet Take 1 tablet by mouth every morning.     olmesartan (BENICAR) 40 MG tablet Take 1 tablet (40 mg total) by mouth daily. 90 tablet 3    Olopatadine HCl 0.2 % SOLN Place 1 drop into both eyes daily as needed. 2.5 mL 5   No current facility-administered medications for this visit.    Known medication allergies: Allergies  Allergen Reactions   Lipitor [Atorvastatin] Nausea And Vomiting and Other (See Comments)    Made her loopy   Simvastatin Other (See Comments)    Increased LFT's     Physical examination: Blood pressure 122/68, pulse 74, temperature 98 F (36.7 C), temperature source Temporal, resp. rate 16, height 5' 4.5" (1.638 m), weight 156 lb 14.4 oz (71.2 kg), SpO2 97 %.  General: Alert, interactive, in no acute distress. HEENT: PERRLA, TMs pearly gray, turbinates moderately edematous without discharge, post-pharynx non erythematous. Neck: Supple without lymphadenopathy. Lungs: Clear to auscultation without wheezing, rhonchi or rales. {no increased work of breathing. CV: Normal S1, S2 without murmurs. Abdomen: Nondistended, nontender. Skin: Warm and dry, without lesions or rashes. Extremities:  No clubbing, cyanosis or edema. Neuro:   Grossly intact.  Diagnositics/Labs: None today  Assessment and plan: Rhinoconjunctivitis, presumed allergic  - Return next Wednesday, 12/20/22 at [facility] for skin testing.   Remember to hold all antihistamines for 3 days prior to this visit - Start taking: Xyzal (levocetirizine) '5mg'$  tablet once daily.  Samples provided.  Flonase (fluticasone) two sprays per nostril daily for 1-2 weeks at a time before stopping once nasal congestion improves for maximum benefit. Astepro (azelastine) 2 sprays per nostril 1-2 times daily as needed for drainage. When using nasal sprays - AIM FOR EAR ON EACH SIDE Pataday (olopatadine) one drop per eye daily as needed for itchy/watery eyes.  - You can use an extra dose of the antihistamine, if needed, for breakthrough symptoms.  - Consider nasal saline rinses 1-2 times daily to remove allergens from the nasal cavities as well as help with mucous  clearance (this is especially helpful to do before the nasal sprays are given)  Follow-up next week for skin testing  I appreciate the opportunity to take part in Lahna's care. Please do not hesitate to contact me with questions.  Sincerely,   Prudy Feeler, MD Allergy/Immunology [facility]

## 2022-12-18 ENCOUNTER — Other Ambulatory Visit: Payer: Self-pay | Admitting: Allergy

## 2022-12-20 ENCOUNTER — Encounter: Payer: Self-pay | Admitting: Allergy

## 2022-12-20 ENCOUNTER — Other Ambulatory Visit: Payer: Self-pay

## 2022-12-20 ENCOUNTER — Ambulatory Visit (INDEPENDENT_AMBULATORY_CARE_PROVIDER_SITE_OTHER): Payer: Medicare Other | Admitting: Allergy

## 2022-12-20 VITALS — BP 126/62 | HR 67 | Temp 98.1°F | Ht 64.5 in | Wt 156.3 lb

## 2022-12-20 DIAGNOSIS — J3089 Other allergic rhinitis: Secondary | ICD-10-CM | POA: Diagnosis not present

## 2022-12-20 DIAGNOSIS — H1013 Acute atopic conjunctivitis, bilateral: Secondary | ICD-10-CM | POA: Diagnosis not present

## 2022-12-20 NOTE — Patient Instructions (Signed)
-   Testing today showed: grasses, ragweed, weeds, trees, indoor molds, outdoor molds, dust mites, and cat. - Copy of test results provided.  - Avoidance measures provided. - Continue taking:  Xyzal (levocetirizine) 5mg  tablet once daily.   Flonase (fluticasone) two sprays per nostril daily for 1-2 weeks at a time before stopping once nasal congestion improves for maximum benefit. Astepro (azelastine) 2 sprays per nostril 1-2 times daily as needed for drainage. When using nasal sprays - AIM FOR EAR ON EACH SIDE Pataday (olopatadine) one drop per eye daily as needed for itchy/watery eyes.  - You can use an extra dose of the antihistamine, if needed, for breakthrough symptoms.  - Consider nasal saline rinses 1-2 times daily to remove allergens from the nasal cavities as well as help with mucous clearance (this is especially helpful to do before the nasal sprays are given) - Allergy shots "re-train" and "reset" the immune system to ignore environmental allergens and decrease the resulting immune response to those allergens (sneezing, itchy watery eyes, runny nose, nasal congestion, etc).    - Allergy shots improve symptoms in 75-85% of patients.  - We can discuss more at a future appointment if the medications are not working for you.   Follow-up in 3-4 months or sooner if needed

## 2022-12-20 NOTE — Progress Notes (Signed)
Follow-up Note  RE: Sandra Mccoy MRN: [id number] DOB: [birth year] Date of Office Visit: 12/20/2022   History of present illness: Sandra Mccoy is a 72 y.o. female presenting today for skin testing visit.  She was last seen in the office on 12/13/2022 for rhinoconjunctivitis evaluation.  She has held antihistamines for skin testing today.  She has not had any recent illnesses.   Medication List: Current Outpatient Medications  Medication Sig Dispense Refill   acetaminophen (TYLENOL) 325 MG tablet Take 2 tablets (650 mg total) by mouth every 6 (six) hours as needed for mild pain (or Fever >/= 101).     clobetasol ointment (TEMOVATE) [id number] % Apply 1 application topically 2 (two) times daily as needed (eczema).     desonide (DESOWEN) 0.05 % cream Apply 1 Application topically 2 (two) times daily.     diltiazem (CARDIZEM LA) 120 MG 24 hr tablet Take 1 tablet (120 mg total) by mouth daily. 90 tablet 3   ezetimibe (ZETIA) 10 MG tablet Take 10 mg by mouth every morning.     fluticasone (FLONASE) 50 MCG/ACT nasal spray Place 2 sprays into both nostrils 2 (two) times daily as needed for allergies or rhinitis. 16 g 5   furosemide (LASIX) 20 MG tablet Take 1 tablet (20 mg total) by mouth daily. 90 tablet 3   GARLIC PO Take 1 tablet by mouth at bedtime.     Ginger, Zingiber officinalis, (GINGER PO) Take 1 tablet by mouth at bedtime.     hydrOXYzine (ATARAX) 10 MG tablet Take 1 tablet (10 mg total) by mouth 3 (three) times daily as needed for itching. 30 tablet 0   levocetirizine (XYZAL) 5 MG tablet Take 1 tablet (5 mg total) by mouth every evening. 30 tablet 5   levothyroxine (SYNTHROID) 88 MCG tablet Take 88 mcg by mouth every morning.     loratadine (CLARITIN) 10 MG tablet Take 10 mg by mouth at bedtime as needed for allergies (congestion).     Multiple Vitamin (MULTIVITAMIN WITH MINERALS) TABS tablet Take 1 tablet by mouth every morning.     olmesartan (BENICAR) 40 MG tablet Take 1 tablet (40 mg  total) by mouth daily. 90 tablet 3   Olopatadine HCl 0.2 % SOLN Place 1 drop into both eyes daily as needed. 2.5 mL 5   azelastine (ASTELIN) 0.1 % nasal spray Place 2 sprays into both nostrils 2 (two) times daily. 30 mL 5   No current facility-administered medications for this visit.     Known medication allergies: Allergies  Allergen Reactions   Lipitor [Atorvastatin] Nausea And Vomiting and Other (See Comments)    Made her loopy   Simvastatin Other (See Comments)    Increased LFT's     Physical examination (limited): Blood pressure 126/62, pulse 67, temperature 98.1 F (36.7 C), height 5' 4.5" (1.638 m), weight 156 lb 4.8 oz (70.9 kg), SpO2 99 %.  General: Alert, interactive, in no acute distress. Skin: Lateral areas of her flank and upper back around the shoulder blades with hyperpigmented patches to plaques that have some scaling .  Diagnositics/Labs:  Allergy testing:   Airborne Adult Perc - 12/20/22 0906     Time Antigen Placed 0930    Allergen Manufacturer Lavella Hammock    Location Back    Number of Test 57    Panel 1 Select    1. Control-Buffer 50% Glycerol Negative    2. Control-Histamine 1 mg/ml 2+    3. Albumin saline Negative  4. [place] 4+    5. [place] 3+    6. [place] 4+    7. [place] 4+    10. Sweet Vernal 4+    11. Timothy 3+    12. Cocklebur 2+    13. Burweed Marshelder 3+    14. Ragweed, short 4+    15. Ragweed, Giant 2+    16. Plantain,  English 3+    17. Lamb's Quarters 2+    18. Sheep Sorrell 3+    19. Rough Pigweed 4+    20. Marsh Elder, Rough 3+    21. Mugwort, Common 2+    22. Ash mix 4+    23. Birch mix 4+    24. Beech American 4+    25. Box, Elder 3+    26. Cedar, red Negative    27. Cottonwood, Eastern 4+    28. Elm mix 2+    29. Hickory 3+    30. Maple mix 2+    31. Oak, [place] mix 4+    32. Pecan Pollen 4+    33. Pine mix Negative    34. Sycamore Eastern 4+    35. [place], Black Pollen 4+    36. Alternaria alternata 2+     37. Cladosporium Herbarum 2+    38. Aspergillus mix 2+    39. Penicillium mix Negative    40. Bipolaris sorokiniana (Helminthosporium) 2+    41. Drechslera spicifera (Curvularia) Negative    42. Mucor plumbeus Negative    43. Fusarium moniliforme 2+    44. Aureobasidium pullulans (pullulara) Negative    45. Rhizopus oryzae Negative    46. Botrytis cinera Negative    47. Epicoccum nigrum Negative    48. Phoma betae Negative    49. Candida Albicans Negative    50. Trichophyton mentagrophytes Negative    51. Mite, D Farinae  5,000 AU/ml 2+    52. Mite, D Pteronyssinus  5,000 AU/ml Negative    53. Cat Hair 10,000 BAU/ml 2+    54.  Dog Epithelia Negative    55. Mixed Feathers Negative    56. Horse Epithelia Negative    57. Cockroach, German Negative    58. Mouse Negative    59. Tobacco Leaf Negative    Other Omitted    Other Omitted             Allergy testing results were read and interpreted by provider, documented by clinical staff.   Assessment and plan: Allergic rhinitis with conjunctivitis  - Testing today showed: grasses, ragweed, weeds, trees, indoor molds, outdoor molds, dust mites, and cat. - Copy of test results provided.  - Avoidance measures provided. - Continue taking:  Xyzal (levocetirizine) 5mg  tablet once daily.   Flonase (fluticasone) two sprays per nostril daily for 1-2 weeks at a time before stopping once nasal congestion improves for maximum benefit. Astepro (azelastine) 2 sprays per nostril 1-2 times daily as needed for drainage. When using nasal sprays - AIM FOR EAR ON EACH SIDE Pataday (olopatadine) one drop per eye daily as needed for itchy/watery eyes.  - You can use an extra dose of the antihistamine, if needed, for breakthrough symptoms.  - Consider nasal saline rinses 1-2 times daily to remove allergens from the nasal cavities as well as help with mucous clearance (this is especially helpful to do before the nasal sprays are given) - Allergy  shots "re-train" and "reset" the immune system to ignore environmental allergens and decrease the resulting immune response  to those allergens (sneezing, itchy watery eyes, runny nose, nasal congestion, etc).    - Allergy shots improve symptoms in 75-85% of patients.  - We can discuss more at a future appointment if the medications are not working for you.   Follow-up in 3-4 months or sooner if needed  I appreciate the opportunity to take part in Merdis's care. Please do not hesitate to contact me with questions.  Sincerely,   Prudy Feeler, MD Allergy/Immunology [facility] [place] [facility] [place]

## 2023-01-24 ENCOUNTER — Other Ambulatory Visit: Payer: Self-pay

## 2023-01-24 ENCOUNTER — Emergency Department (HOSPITAL_COMMUNITY)
Admission: EM | Admit: 2023-01-24 | Discharge: 2023-01-24 | Disposition: A | Discharge status: Home / Self Care | Payer: Medicare Other | Attending: Emergency Medicine | Admitting: Emergency Medicine

## 2023-01-24 DIAGNOSIS — R55 Syncope and collapse: Secondary | ICD-10-CM | POA: Insufficient documentation

## 2023-01-24 DIAGNOSIS — E119 Type 2 diabetes mellitus without complications: Secondary | ICD-10-CM | POA: Diagnosis not present

## 2023-01-24 DIAGNOSIS — I10 Essential (primary) hypertension: Secondary | ICD-10-CM | POA: Diagnosis not present

## 2023-01-24 LAB — CBC WITH DIFFERENTIAL/PLATELET
Abs Immature Granulocytes: 0.02 10*3/uL (ref 0.00–0.07)
Basophils Absolute: 0 10*3/uL (ref 0.0–0.1)
Basophils Relative: 0 %
Eosinophils Absolute: 0.1 10*3/uL (ref 0.0–0.5)
Eosinophils Relative: 1 %
HCT: 40.5 % (ref 36.0–46.0)
Hemoglobin: 13.6 g/dL (ref 12.0–15.0)
Immature Granulocytes: 0 %
Lymphocytes Relative: 23 %
Lymphs Abs: 2.3 10*3/uL (ref 0.7–4.0)
MCH: 31.9 pg (ref 26.0–34.0)
MCHC: 33.6 g/dL (ref 30.0–36.0)
MCV: 95.1 fL (ref 80.0–100.0)
Monocytes Absolute: 0.6 10*3/uL (ref 0.1–1.0)
Monocytes Relative: 6 %
Neutro Abs: 7 10*3/uL (ref 1.7–7.7)
Neutrophils Relative %: 70 %
Platelets: 206 10*3/uL (ref 150–400)
RBC: 4.26 MIL/uL (ref 3.87–5.11)
RDW: 13.4 % (ref 11.5–15.5)
WBC: 10.1 10*3/uL (ref 4.0–10.5)
nRBC: 0 % (ref 0.0–0.2)

## 2023-01-24 LAB — URINALYSIS, ROUTINE W REFLEX MICROSCOPIC
Bacteria, UA: NONE SEEN
Bilirubin Urine: NEGATIVE
Glucose, UA: NEGATIVE mg/dL
Hgb urine dipstick: NEGATIVE
Ketones, ur: NEGATIVE mg/dL
Nitrite: NEGATIVE
Protein, ur: 30 mg/dL — AB
Specific Gravity, Urine: 1.018 (ref 1.005–1.030)
pH: 7 (ref 5.0–8.0)

## 2023-01-24 LAB — CBG MONITORING, ED: Glucose-Capillary: 113 mg/dL — ABNORMAL HIGH (ref 70–99)

## 2023-01-24 LAB — BASIC METABOLIC PANEL
Anion gap: 10 (ref 5–15)
BUN: 22 mg/dL (ref 8–23)
CO2: 25 mmol/L (ref 22–32)
Calcium: 10.2 mg/dL (ref 8.9–10.3)
Chloride: 104 mmol/L (ref 98–111)
Creatinine, Ser: 0.91 mg/dL (ref 0.44–1.00)
GFR, Estimated: >60 mL/min (ref >60)
Glucose, Bld: 103 mg/dL — ABNORMAL HIGH (ref 70–99)
Potassium: 4.1 mmol/L (ref 3.5–5.1)
Sodium: 139 mmol/L (ref 135–145)

## 2023-01-24 LAB — TROPONIN I (HIGH SENSITIVITY): Troponin I (High Sensitivity): 5 ng/L (ref <18)

## 2023-01-24 MED ORDER — SODIUM CHLORIDE 0.9 % IV BOLUS
1000.0000 mL | Freq: Once | INTRAVENOUS | Status: AC
  Administered 2023-01-24: 1000 mL via INTRAVENOUS

## 2023-01-24 NOTE — ED Triage Notes (Signed)
Patient brought in from home by EMS for near syncope episode. This was witnessed by patient husband. Patient did not fall, no injuries and no thinners. She is A&Ox4, denies pain only voices weakness and fatigue. She has a HX of diabetes and HTN. 92/60 60 18 98% RA CBG: 110

## 2023-01-24 NOTE — ED Notes (Signed)
Pt A&Ox4 ambulatory with independent steady gait but wheeled out of ED via wheelchair. Pt verbalized understanding of d/c instructions and follow up care.

## 2023-01-24 NOTE — ED Provider Notes (Signed)
[place] EMERGENCY DEPARTMENT AT [facility] Provider Note   CSN: [id number] Arrival date & time: 01/24/23  1407     History  Chief Complaint  Patient presents with   Near Syncope    Sandra Mccoy is a 72 y.o. female. patient presents to the emergency department via EMS complaining of a syncopal episode.  Patient reportedly had a syncopal episode witnessed by both her husband and daughter.  She did not fall reportedly.  She states she was walking from the couch to the kitchen when the incident reportedly happened.  She does not take blood thinners.  She states she had a similar episode approximately 3 months ago.  Upon arrival the patient is alert and oriented x 4.  She does endorse a period of fatigue just after taking a nap earlier today which has resolved.  She denies abdominal pain, nausea, vomiting, headache, weakness, fatigue, urinary symptoms.  Patient does have history significant for type II DM, hypertension  HPI     Home Medications Prior to Admission medications   Medication Sig Start Date End Date Taking? Authorizing Provider  acetaminophen (TYLENOL) 325 MG tablet Take 2 tablets (650 mg total) by mouth every 6 (six) hours as needed for mild pain (or Fever >/= 101). 01/04/21   Meuth, Brooke A, PA-C  azelastine (ASTELIN) 0.1 % nasal spray Place 2 sprays into both nostrils 2 (two) times daily. 12/20/22   Marcelyn Bruins, MD  clobetasol ointment (TEMOVATE) 0.05 % Apply 1 application topically 2 (two) times daily as needed (eczema).    [provider]  desonide (DESOWEN) 0.05 % cream Apply 1 Application topically 2 (two) times daily.    [provider]  diltiazem (CARDIZEM LA) 120 MG 24 hr tablet Take 1 tablet (120 mg total) by mouth daily. 07/15/21   Romero Belling, MD  ezetimibe (ZETIA) 10 MG tablet Take 10 mg by mouth every morning.    [provider]  fluticasone (FLONASE) 50 MCG/ACT nasal spray Place 2 sprays into both nostrils 2  (two) times daily as needed for allergies or rhinitis. 12/13/22   Marcelyn Bruins, MD  furosemide (LASIX) 20 MG tablet Take 1 tablet (20 mg total) by mouth daily. 08/17/21   Romero Belling, MD  GARLIC PO Take 1 tablet by mouth at bedtime.    [provider]  Ginger, Zingiber officinalis, (GINGER PO) Take 1 tablet by mouth at bedtime.    [provider]  hydrOXYzine (ATARAX) 10 MG tablet Take 1 tablet (10 mg total) by mouth 3 (three) times daily as needed for itching. 10/10/22   Standley Brooking, MD  levocetirizine (XYZAL) 5 MG tablet Take 1 tablet (5 mg total) by mouth every evening. 12/13/22   Marcelyn Bruins, MD  levothyroxine (SYNTHROID) 88 MCG tablet Take 88 mcg by mouth every morning. 10/14/20   [provider]  loratadine (CLARITIN) 10 MG tablet Take 10 mg by mouth at bedtime as needed for allergies (congestion).    [provider]  Multiple Vitamin (MULTIVITAMIN WITH MINERALS) TABS tablet Take 1 tablet by mouth every morning.    [provider]  olmesartan (BENICAR) 40 MG tablet Take 1 tablet (40 mg total) by mouth daily. 07/15/21   Romero Belling, MD  Olopatadine HCl 0.2 % SOLN Place 1 drop into both eyes daily as needed. 12/13/22   Marcelyn Bruins, MD      Allergies    Lipitor [atorvastatin] and Simvastatin    Review of  Systems   Review of Systems  Physical Exam Updated Vital Signs BP 135/68   Pulse 70   Temp (!) 97.5 F (36.4 C) (Oral)   Resp 16   Ht 5\' 3"  (1.6 m)   Wt 71.7 kg   SpO2 100%   BMI 27.99 kg/m  Physical Exam Vitals and nursing note reviewed.  Constitutional:      General: She is not in acute distress.    Appearance: She is well-developed.  HENT:     Head: Normocephalic and atraumatic.  Eyes:     Conjunctiva/sclera: Conjunctivae normal.  Cardiovascular:     Rate and Rhythm: Normal rate and regular rhythm.     Heart sounds: No murmur heard. Pulmonary:     Effort: Pulmonary effort is  normal. No respiratory distress.     Breath sounds: Normal breath sounds.  Abdominal:     Palpations: Abdomen is soft.     Tenderness: There is no abdominal tenderness.  Musculoskeletal:        General: No swelling.     Cervical back: Neck supple.     Right lower leg: No edema.     Left lower leg: No edema.  Skin:    General: Skin is warm and dry.     Capillary Refill: Capillary refill takes less than 2 seconds.  Neurological:     Mental Status: She is alert and oriented to person, place, and time.     Cranial Nerves: No cranial nerve deficit.     Sensory: No sensory deficit.     Motor: No weakness.     Coordination: Coordination normal.     Gait: Gait normal.  Psychiatric:        Mood and Affect: Mood normal.     ED Results / Procedures / Treatments   Labs (all labs ordered are listed, but only abnormal results are displayed) Labs Reviewed  BASIC METABOLIC PANEL - Abnormal; Notable for the following components:      Result Value   Glucose, Bld 103 (*)    All other components within normal limits  URINALYSIS, ROUTINE W REFLEX MICROSCOPIC - Abnormal; Notable for the following components:   Protein, ur 30 (*)    Leukocytes,Ua TRACE (*)    All other components within normal limits  CBG MONITORING, ED - Abnormal; Notable for the following components:   Glucose-Capillary 113 (*)    All other components within normal limits  CBC WITH DIFFERENTIAL/PLATELET  TROPONIN I (HIGH SENSITIVITY)    EKG EKG Interpretation  Date/Time:  Wednesday January 24 2023 15:37:46 EDT Ventricular Rate:  59 PR Interval:  151 QRS Duration: 78 QT Interval:  398 QTC Calculation: 395 R Axis:   51 Text Interpretation: Sinus rhythm Low voltage, precordial leads Probable anteroseptal infarct, old no sig change from previouis Confirmed by Arby Barrette [phone]) on 01/24/2023 4:27:42 PM  Radiology No results found.  Procedures Procedures    Medications Ordered in ED Medications  sodium  chloride 0.9 % bolus 1,000 mL (0 mLs Intravenous Stopped 01/24/23 1854)    ED Course/ Medical Decision Making/ A&P                             Medical Decision Making Amount and/or Complexity of Data Reviewed Labs: ordered.   This patient presents to the ED for concern of syncope, this involves an extensive number of treatment options, and is a complaint that carries with it a high risk  of complications and morbidity.  The differential diagnosis includes vasovagal syncope, dehydration/orthostatic changes, dysrhythmia, intracranial abnormality, electrolyte abnormality, others   Co morbidities that complicate the patient evaluation  Type II DM   Additional history obtained:  Additional history obtained from EMS, patient's family External records from outside source obtained and reviewed including discharge summary from hospitalist from January 9 of this year, syncope thought to be vasovagal in nature   Lab Tests:  I Ordered, and personally interpreted labs.  The pertinent results include: Unremarkable CBC, BMP.  Troponin 5.   Imaging Studies ordered:  I considered head CT but the patient has no neurologic symptoms.  No headache.  No trauma.   Cardiac Monitoring: / EKG:  The patient was maintained on a cardiac monitor.  I personally viewed and interpreted the cardiac monitored which showed an underlying rhythm of: Sinus rhythm   Problem List / ED Course / Critical interventions / Medication management   I ordered medication including normal saline bolus for fluid resuscitation Reevaluation of the patient after these medicines showed that the patient improved I have reviewed the patients home medicines and have made adjustments as needed   Test / Admission - Considered:  No dysrhythmia.  No focal neurodeficits to suggest intracranial abnormality.  No significant orthostatic changes.  Possible vasovagal syncope versus early orthostatic changes, now resolved with fluid  administration.  Plan to discharge home with recommendations for follow-up with outpatient primary care.  Return precautions provided.        Final Clinical Impression(s) / ED Diagnoses Final diagnoses:  Syncope, unspecified syncope type    Rx / DC Orders ED Discharge Orders     None         Pamala Duffel 01/24/23 1921    Charlynne Pander, MD 01/24/23 [phone]

## 2023-01-24 NOTE — Discharge Instructions (Addendum)
You were evaluated today for a syncopal episode.  Your workup was reassuring.  Please continue to hydrate as you are able with water and follow-up with your primary care provider.  If you suffer further syncopal episodes or other life-threatening symptoms please return to the emergency department.

## 2023-03-08 ENCOUNTER — Encounter: Payer: Self-pay | Admitting: Cardiology

## 2023-03-08 ENCOUNTER — Ambulatory Visit: Payer: Medicare Other | Admitting: Cardiology

## 2023-03-08 DIAGNOSIS — R55 Syncope and collapse: Secondary | ICD-10-CM

## 2023-03-08 DIAGNOSIS — I10 Essential (primary) hypertension: Secondary | ICD-10-CM

## 2023-03-08 DIAGNOSIS — E782 Mixed hyperlipidemia: Secondary | ICD-10-CM

## 2023-03-08 NOTE — Progress Notes (Signed)
Patient referred by [name], MD for syncope  Subjective:   Sandra Mccoy, female    DOB: August 10, 1951, 72 y.o.   MRN: [id number]   Chief Complaint  Patient presents with   Loss of Consciousness   New Patient (Initial Visit)     HPI  72 y.o. African-American female with hypertension, hyperlipidemia, type 2 diabetes mellitus, referred for syncope  Patient is retired, she babysits her 61-month old granddaughter.  At baseline, patient does not have any symptoms of chest pain, shortness of breath, lightheadedness in general.  She has had 2 syncope episodes in 10/2022, and 01/2023.  01/2023: Patient was sitting on the couch in the morning of her breakfast, watching shows with her daughter.  She then got up to go to the kitchen, felt lightheaded, sat down on a chair and then lost consciousness.  She denies any preceding chest pain, shortness of breath, palpitation symptoms.  She lost consciousness for about 3-5 minutes.  EMS were called, who took her to emergency room.  Workup in the emergency room was largely unremarkable.  Her episode was felt to be related to possible dehydration or orthostatic hypotension, symptoms resolved after fluid administration.  Patient did not have any loss of bowel or bladder control, no seizures.  10/2022: Patient remembers going to the bathroom to urinate, sat on the commode, and lost consciousness for about 3-4 minutes.  She may have lost control of the read that time, but she does not recall clearly.  Patient reports that she may have had another syncopal episode about 2 years ago.  Since 01/2023, patient has not had any recurrent syncope episode, or lightheadedness.  She drinks about 3 bottles of water every day, does not drink alcohol or caffeine.   Past Medical History:  Diagnosis Date   Acute cholecystitis 01/01/2021   Diabetes mellitus without complication (HCC)    Eczema    Hyperlipidemia    Hypertension      Past Surgical History:   Procedure Laterality Date   CHOLECYSTECTOMY N/A 01/03/2021   Procedure: LAPAROSCOPIC CHOLECYSTECTOMY WITH INTRAOPERATIVE CHOLANGIOGRAM AND UMBLICIAL HERNIA REPAIR;  Surgeon: Darnell Level, MD;  Location: [facility] ORS;  Service: General;  Laterality: N/A;     Social History   Tobacco Use  Smoking Status Never   Passive exposure: Never  Smokeless Tobacco Never    Social History   Substance and Sexual Activity  Alcohol Use No     Family History  Problem Relation Age of Onset   Hypertension Mother    Hypertension Father    Asthma Sister    Hypercalcemia Neg Hx       Current Outpatient Medications:    azelastine (ASTELIN) 0.1 % nasal spray, Place 2 sprays into both nostrils 2 (two) times daily., Disp: 30 mL, Rfl: 5   desonide (DESOWEN) 0.05 % cream, Apply 1 Application topically 2 (two) times daily., Disp: , Rfl:    diltiazem (CARDIZEM LA) 120 MG 24 hr tablet, Take 1 tablet (120 mg total) by mouth daily., Disp: 90 tablet, Rfl: 3   ezetimibe (ZETIA) 10 MG tablet, Take 10 mg by mouth every morning., Disp: , Rfl:    fluticasone (FLONASE) 50 MCG/ACT nasal spray, Place 2 sprays into both nostrils 2 (two) times daily as needed for allergies or rhinitis., Disp: 16 g, Rfl: 5   furosemide (LASIX) 20 MG tablet, Take 1 tablet (20 mg total) by mouth daily., Disp: 90 tablet, Rfl: 3   GARLIC PO, Take 1 tablet by  mouth at bedtime., Disp: , Rfl:    Ginger, Zingiber officinalis, (GINGER PO), Take 1 tablet by mouth at bedtime., Disp: , Rfl:    hydrOXYzine (ATARAX) 10 MG tablet, Take 1 tablet (10 mg total) by mouth 3 (three) times daily as needed for itching., Disp: 30 tablet, Rfl: 0   levocetirizine (XYZAL) 5 MG tablet, Take 1 tablet (5 mg total) by mouth every evening., Disp: 30 tablet, Rfl: 5   levothyroxine (SYNTHROID) 88 MCG tablet, Take 88 mcg by mouth every morning., Disp: , Rfl:    Multiple Vitamin (MULTIVITAMIN WITH MINERALS) TABS tablet, Take 1 tablet by mouth every morning., Disp: , Rfl:     olmesartan (BENICAR) 40 MG tablet, Take 1 tablet (40 mg total) by mouth daily., Disp: 90 tablet, Rfl: 3   Olopatadine HCl 0.2 % SOLN, Place 1 drop into both eyes daily as needed., Disp: 2.5 mL, Rfl: 5   triamcinolone cream (KENALOG) 0.1 %, Apply 1 Application topically 2 (two) times daily., Disp: , Rfl:    clobetasol ointment (TEMOVATE) 0.05 %, Apply 1 application topically 2 (two) times daily as needed (eczema)., Disp: , Rfl:    Cardiovascular and other pertinent studies:  Reviewed external labs and tests, independently interpreted  EKG 03/08/2023: Sinus rhythm 62 bpm Normal EKG   Recent labs: 10/12/2022: Glucose 166, BUN/Cr 16/1.06. EGFR 56. Na/K 143/4.1. Rest of the CMP normal CBC NA HbA1C 6.6% Chol 222, TG 139, HDL 63, LDL 135 TSH 2.0 normal    Review of Systems  Cardiovascular:  Positive for syncope. Negative for chest pain, dyspnea on exertion, leg swelling and palpitations.         Vitals:   03/08/23 0940 03/08/23 0951  BP: (!) 158/81   Pulse: 64   SpO2: 99% 96%   Orthostatic VS for the past 72 hrs (Last 3 readings):  Orthostatic BP Patient Position BP Location Cuff Size Orthostatic Pulse  03/08/23 0954 146/67 Standing Left Arm Normal 70  03/08/23 0953 147/74 Sitting Left Arm Normal 61  03/08/23 0951 145/69 Supine Left Arm Normal 61     Body mass index is 29.55 kg/m. Filed Weights   03/08/23 0940  Weight: 166 lb 12.8 oz (75.7 kg)     Objective:   Physical Exam Vitals and nursing note reviewed.  Constitutional:      General: She is not in acute distress. Neck:     Vascular: No JVD.  Cardiovascular:     Rate and Rhythm: Normal rate and regular rhythm.     Heart sounds: Normal heart sounds. No murmur heard. Pulmonary:     Effort: Pulmonary effort is normal.     Breath sounds: Normal breath sounds. No wheezing or rales.  Musculoskeletal:     Right lower leg: No edema.     Left lower leg: No edema.         Visit diagnoses:   ICD-10-CM   1.  Syncope and collapse  R55 EKG 12-Lead    PCV ECHOCARDIOGRAM COMPLETE    LONG TERM MONITOR-LIVE TELEMETRY (3-14 DAYS)    2. Essential hypertension  I10     3. Mixed hyperlipidemia  E78.2        Orders Placed This Encounter  Procedures   LONG TERM MONITOR-LIVE TELEMETRY (3-14 DAYS)   EKG 12-Lead   PCV ECHOCARDIOGRAM COMPLETE      Assessment & Recommendations:   72 y.o. African-American female with hypertension, hyperlipidemia, type 2 diabetes mellitus, referred for syncope  Syncope: Possible component of dehydration  or orthostatic hypotension.  Today, she is not orthostatic.  She has not had any recurrent syncope since 01/2023.  Given her age, and risk factors, recommend echocardiogram and live cardiac telemetry.  If workup is unremarkable, will consider implantable loop recorder in future.  Recommend liberal fluid hydration, compression stockings.  Hypertension: Blood pressure slightly elevated.  However, given her recent syncope episodes, I would except slightly elevated blood pressure for now.  Mixed hyperlipidemia: States that her cholesterol has been up-and-down, currently not on lipid-lowering agent. Will discuss lipid-lowering therapy further after her workup for syncope.  Further recommendations after above testing   Thank you for referring the patient to [place]. Please feel free to contact with any questions.   Elder Negus, MD Pager: [phone] Office: [phone]

## 2023-03-13 ENCOUNTER — Ambulatory Visit: Payer: Medicare Other

## 2023-03-13 DIAGNOSIS — R55 Syncope and collapse: Secondary | ICD-10-CM

## 2023-03-30 ENCOUNTER — Ambulatory Visit: Payer: Medicare Other

## 2023-03-30 DIAGNOSIS — R55 Syncope and collapse: Secondary | ICD-10-CM

## 2023-04-18 ENCOUNTER — Encounter: Payer: Self-pay | Admitting: Allergy

## 2023-04-18 ENCOUNTER — Ambulatory Visit (INDEPENDENT_AMBULATORY_CARE_PROVIDER_SITE_OTHER): Payer: Medicare Other | Admitting: Allergy

## 2023-04-18 ENCOUNTER — Other Ambulatory Visit: Payer: Self-pay

## 2023-04-18 VITALS — BP 120/62 | HR 77 | Temp 98.7°F | Ht 63.0 in | Wt 166.4 lb

## 2023-04-18 DIAGNOSIS — H1013 Acute atopic conjunctivitis, bilateral: Secondary | ICD-10-CM

## 2023-04-18 DIAGNOSIS — J3089 Other allergic rhinitis: Secondary | ICD-10-CM

## 2023-04-18 DIAGNOSIS — J302 Other seasonal allergic rhinitis: Secondary | ICD-10-CM

## 2023-04-18 MED ORDER — BEPOTASTINE BESILATE 1.5 % OP SOLN: 1.0000 [drp] | Freq: Two times a day (BID) | OPHTHALMIC | 5 refills | Status: DC | PRN

## 2023-04-18 NOTE — Progress Notes (Signed)
Follow-up Note  RE: Sandra Mccoy MRN: [id number] DOB: Nov 05, 1950 Date of Office Visit: 04/18/2023   History of present illness: Sandra Mccoy is a 72 y.o. female presenting today for follow-up of allergic rhinitis with conjunctivitis.  She was last seen in the office on 12/20/2022 by myself.  She has not had any major health changes, surgeries or hospitalizations since this visit.  She feels like she has come down with a cold with symptoms of nasal drainage, watery eyes and a 'little' cough.  No fever.  She had been taking OTC cold medications and benadryl.   She states that the current allergy medication regimen is working for well and at this time does not feel the need to pursue immunotherapy. She is taking xyzal dail and this helps.  She states she has been able to use less nasal sprays.  She has flonase and Astepro.  She eye drop pataday she states works well but also states her eyes have been watering and has been doing more outdoor activity.   Review of systems in the past 4 weeks: Review of Systems  Constitutional: Negative.   HENT:         See HPI  Eyes:        See HPI  Respiratory: Negative.    Cardiovascular: Negative.   Gastrointestinal: Negative.   Musculoskeletal: Negative.   Skin: Negative.   Allergic/Immunologic: Negative.   Neurological: Negative.      All other systems negative unless noted above in HPI  Past medical/social/surgical/family history have been reviewed and are unchanged unless specifically indicated below.  No changes  Medication List: Current Outpatient Medications  Medication Sig Dispense Refill   azelastine (ASTELIN) 0.1 % nasal spray Place 2 sprays into both nostrils 2 (two) times daily. 30 mL 5   clobetasol ointment (TEMOVATE) 0.05 % Apply 1 application topically 2 (two) times daily as needed (eczema).     desonide (DESOWEN) 0.05 % cream Apply 1 Application topically 2 (two) times daily.     diltiazem (CARDIZEM LA) 120 MG 24 hr tablet  Take 1 tablet (120 mg total) by mouth daily. 90 tablet 3   diltiazem (CARDIZEM LA) 120 MG 24 hr tablet Take 120 mg by mouth daily.     ezetimibe (ZETIA) 10 MG tablet Take 10 mg by mouth every morning.     fluticasone (FLONASE) 50 MCG/ACT nasal spray Place 2 sprays into both nostrils 2 (two) times daily as needed for allergies or rhinitis. 16 g 5   furosemide (LASIX) 20 MG tablet Take 1 tablet (20 mg total) by mouth daily. 90 tablet 3   GARLIC PO Take 1 tablet by mouth at bedtime.     Ginger, Zingiber officinalis, (GINGER PO) Take 1 tablet by mouth at bedtime.     hydrOXYzine (ATARAX) 10 MG tablet Take 1 tablet (10 mg total) by mouth 3 (three) times daily as needed for itching. 30 tablet 0   levocetirizine (XYZAL) 5 MG tablet Take 1 tablet (5 mg total) by mouth every evening. 30 tablet 5   levothyroxine (SYNTHROID) 88 MCG tablet Take 88 mcg by mouth every morning.     Multiple Vitamin (MULTIVITAMIN WITH MINERALS) TABS tablet Take 1 tablet by mouth every morning.     olmesartan (BENICAR) 40 MG tablet Take 1 tablet (40 mg total) by mouth daily. (Patient taking differently: Take 10 mg by mouth daily.) 90 tablet 3   Olopatadine HCl 0.2 % SOLN Place 1 drop into both eyes  daily as needed. 2.5 mL 5   triamcinolone cream (KENALOG) 0.1 % Apply 1 Application topically 2 (two) times daily.     No current facility-administered medications for this visit.     Known medication allergies: Allergies  Allergen Reactions   Lipitor [Atorvastatin] Nausea And Vomiting and Other (See Comments)    Made her loopy   Simvastatin Other (See Comments)    Increased LFT's     Physical examination: Blood pressure 120/62, pulse 77, temperature 98.7 F (37.1 C), height 5\' 3"  (1.6 m), weight 166 lb 6.4 oz (75.5 kg), SpO2 97%.  General: Alert, interactive, in no acute distress. HEENT: PERRLA, TMs pearly gray, turbinates non-edematous with clear discharge, post-pharynx non erythematous. Neck: Supple without  lymphadenopathy. Lungs: Clear to auscultation without wheezing, rhonchi or rales. {no increased work of breathing. CV: Normal S1, S2 without murmurs. Abdomen: Nondistended, nontender. Skin: Warm and dry, without lesions or rashes. Extremities:  No clubbing, cyanosis or edema. Neuro:   Grossly intact.  Diagnositics/Labs: None today   Assessment and plan: Allergic rhinitis with conjunctivitis - continue avoidance measures for grasses, weeds, trees, indoor molds, outdoor molds, dust mites, and cat. -Believe her current symptoms are less likely due to a respiratory illness more likely her allergic rhinitis with conjunctivitis that is flaring up a right now.  Will change eyedrop and will have to maximize her nasal drainage spray. - Continue taking:  Xyzal (levocetirizine) 5mg  tablet once daily.   Astepro (azelastine) 2 sprays per nostril 2 times daily for drainage.  Use this spray at this time for drainage control.   Flonase (fluticasone) two sprays per nostril daily for 1-2 weeks at a time before stopping once nasal congestion improves for maximum benefit.  Can hold this spray for now if not having congestion/stuffy nose.  When using nasal sprays - AIM FOR EAR ON EACH SIDE Bepreve one drop per eye twice a day as needed for itchy/watery eyes.  - You can use an extra dose of the antihistamine, if needed, for breakthrough symptoms.  - Consider nasal saline rinses 1-2 times daily to remove allergens from the nasal cavities as well as help with mucous clearance (this is especially helpful to do before the nasal sprays are given) - Allergy shots "re-train" and "reset" the immune system to ignore environmental allergens and decrease the resulting immune response to those allergens (sneezing, itchy watery eyes, runny nose, nasal congestion, etc).    - Allergy shots improve symptoms in 75-85% of patients.  - We can discuss more at a future appointment if the medications are not working for  you.   Follow-up in 6 months or sooner if needed  I appreciate the opportunity to take part in Sandra Mccoy's care. Please do not hesitate to contact me with questions.  Sincerely,   Margo Aye, MD Allergy/Immunology [facility]

## 2023-04-18 NOTE — Patient Instructions (Addendum)
-   continue avoidance measures for grasses, weeds, trees, indoor molds, outdoor molds, dust mites, and cat. - Continue taking:  Xyzal (levocetirizine) 5mg  tablet once daily.   Astepro (azelastine) 2 sprays per nostril 2 times daily for drainage.  Use this spray at this time for drainage control.   Flonase (fluticasone) two sprays per nostril daily for 1-2 weeks at a time before stopping once nasal congestion improves for maximum benefit.  Can hold this spray for now if not having congestion/stuffy nose.  When using nasal sprays - AIM FOR EAR ON EACH SIDE Bepreve one drop per eye twice a day as needed for itchy/watery eyes.  - You can use an extra dose of the antihistamine, if needed, for breakthrough symptoms.  - Consider nasal saline rinses 1-2 times daily to remove allergens from the nasal cavities as well as help with mucous clearance (this is especially helpful to do before the nasal sprays are given) - Allergy shots "re-train" and "reset" the immune system to ignore environmental allergens and decrease the resulting immune response to those allergens (sneezing, itchy watery eyes, runny nose, nasal congestion, etc).    - Allergy shots improve symptoms in 75-85% of patients.  - We can discuss more at a future appointment if the medications are not working for you.   Follow-up in 6 months or sooner if needed

## 2023-05-03 ENCOUNTER — Ambulatory Visit: Payer: Medicare Other | Admitting: Cardiology

## 2023-05-23 ENCOUNTER — Ambulatory Visit: Payer: Medicare Other | Admitting: Cardiology

## 2023-05-23 ENCOUNTER — Encounter: Payer: Self-pay | Admitting: Cardiology

## 2023-05-23 VITALS — BP 128/66 | HR 58 | Resp 17 | Ht 63.0 in | Wt 164.0 lb

## 2023-05-23 DIAGNOSIS — R55 Syncope and collapse: Secondary | ICD-10-CM

## 2023-05-23 DIAGNOSIS — E782 Mixed hyperlipidemia: Secondary | ICD-10-CM

## 2023-05-23 DIAGNOSIS — I10 Essential (primary) hypertension: Secondary | ICD-10-CM

## 2023-05-23 DIAGNOSIS — I4729 Other ventricular tachycardia: Secondary | ICD-10-CM

## 2023-05-23 NOTE — Progress Notes (Signed)
Patient referred by Sandra Joe, MD for syncope  Subjective:   Sandra Mccoy, female    DOB: [birth year], 72 y.o.   MRN: [id number]   Chief Complaint  Patient presents with   Syncope and collapse   Follow-up    6-8 weeks     HPI  72 y.o. African-American female with hypertension, hyperlipidemia, type 2 diabetes mellitus, referred for syncope  Patient has had another episode of possible syncope. She came out of shower, was getting ready to put on oil and clothes is when she felt weak and lightheaded. She fell down to the ground, may have lost consciousness only for a few seconds. She woke up without any confusion, loss of bowel or bladder tone.   Patient and her family are very concerned.   Initial consultation visit 03/2023: Patient is retired, she babysits her 59-month old granddaughter.  At baseline, patient does not have any symptoms of chest pain, shortness of breath, lightheadedness in general.  She has had 2 syncope episodes in 10/2022, and 01/2023.  01/2023: Patient was sitting on the couch in the morning of her breakfast, watching shows with her daughter.  She then got up to go to the kitchen, felt lightheaded, sat down on a chair and then lost consciousness.  She denies any preceding chest pain, shortness of breath, palpitation symptoms.  She lost consciousness for about 3-5 minutes.  EMS were called, who took her to emergency room.  Workup in the emergency room was largely unremarkable.  Her episode was felt to be related to possible dehydration or orthostatic hypotension, symptoms resolved after fluid administration.  Patient did not have any loss of bowel or bladder control, no seizures.  10/2022: Patient remembers going to the bathroom to urinate, sat on the commode, and lost consciousness for about 3-4 minutes.  She may have lost control of the read that time, but she does not recall clearly.  Patient reports that she may have had another syncopal episode about 2 years  ago.  Since 01/2023, patient has not had any recurrent syncope episode, or lightheadedness.  She drinks about 3 bottles of water every day, does not drink alcohol or caffeine.    Current Outpatient Medications:    azelastine (ASTELIN) 0.1 % nasal spray, Place 2 sprays into both nostrils 2 (two) times daily., Disp: 30 mL, Rfl: 5   Bepotastine Besilate 1.5 % SOLN, Place 1 drop into both eyes 2 (two) times daily as needed (itchy/watery eyes)., Disp: 10 mL, Rfl: 5   clobetasol ointment (TEMOVATE) 0.05 %, Apply 1 application topically 2 (two) times daily as needed (eczema)., Disp: , Rfl:    desonide (DESOWEN) 0.05 % cream, Apply 1 Application topically 2 (two) times daily., Disp: , Rfl:    diltiazem (CARDIZEM LA) 120 MG 24 hr tablet, Take 120 mg by mouth daily., Disp: , Rfl:    ezetimibe (ZETIA) 10 MG tablet, Take 10 mg by mouth every morning., Disp: , Rfl:    fluticasone (FLONASE) 50 MCG/ACT nasal spray, Place 2 sprays into both nostrils 2 (two) times daily as needed for allergies or rhinitis., Disp: 16 g, Rfl: 5   furosemide (LASIX) 20 MG tablet, Take 1 tablet (20 mg total) by mouth daily., Disp: 90 tablet, Rfl: 3   GARLIC PO, Take 1 tablet by mouth at bedtime., Disp: , Rfl:    Ginger, Zingiber officinalis, (GINGER PO), Take 1 tablet by mouth at bedtime., Disp: , Rfl:    hydrOXYzine (ATARAX) 10 MG tablet,  Take 1 tablet (10 mg total) by mouth 3 (three) times daily as needed for itching., Disp: 30 tablet, Rfl: 0   levocetirizine (XYZAL) 5 MG tablet, Take 1 tablet (5 mg total) by mouth every evening., Disp: 30 tablet, Rfl: 5   levothyroxine (SYNTHROID) 88 MCG tablet, Take 88 mcg by mouth every morning., Disp: , Rfl:    Multiple Vitamin (MULTIVITAMIN WITH MINERALS) TABS tablet, Take 1 tablet by mouth every morning., Disp: , Rfl:    olmesartan (BENICAR) 40 MG tablet, Take 1 tablet (40 mg total) by mouth daily. (Patient taking differently: Take 10 mg by mouth daily.), Disp: 90 tablet, Rfl: 3    triamcinolone cream (KENALOG) 0.1 %, Apply 1 Application topically 2 (two) times daily., Disp: , Rfl:    Cardiovascular and other pertinent studies:  Reviewed external labs and tests, independently interpreted  EKG 03/08/2023: Sinus rhythm 62 bpm Normal EKG  Echocardiogram 03/30/2023:  Left ventricle cavity is normal in size. Mild concentric hypertrophy of  the left ventricle. Normal global wall motion. Normal LV systolic function  with EF 65%. Normal diastolic filling pattern. No significant valvular  abnormality.  No evidence of pulmonary hypertension.   Mobile cardiac telemetry 14 days 03/13/2023 - 03/27/2023: Dominant rhythm: Sinus. HR 50-143 bpm. Avg HR 84 bpm, in sinus rhythm.Marland Kitchen 0 episodes of SVT. <1% isolated SVE, couplets. 5 episodes of VT, fastest at 207 bpm for 4 beats, longest for 6 beats at 154 bpm. <1% isolated VE, couplet/triplets. No atrial fibrillation/atrial flutter/SVT/high grade AV block, sinus pause >3sec noted. 0 patient triggered events.   Recent labs: 10/12/2022: Glucose 166, BUN/Cr 16/1.06. EGFR 56. Na/K 143/4.1. Rest of the CMP normal CBC NA HbA1C 6.6% Chol 222, TG 139, HDL 63, LDL 135 TSH 2.0 normal    Review of Systems  Cardiovascular:  Positive for syncope. Negative for chest pain, dyspnea on exertion, leg swelling and palpitations.         There were no vitals filed for this visit.  No data found.    There is no height or weight on file to calculate BMI. There were no vitals filed for this visit.    Objective:   Physical Exam Vitals and nursing note reviewed.  Constitutional:      General: She is not in acute distress. Neck:     Vascular: No JVD.  Cardiovascular:     Rate and Rhythm: Normal rate and regular rhythm.     Heart sounds: Normal heart sounds. No murmur heard. Pulmonary:     Effort: Pulmonary effort is normal.     Breath sounds: Normal breath sounds. No wheezing or rales.  Musculoskeletal:     Right lower leg: No  edema.     Left lower leg: No edema.         Visit diagnoses: No diagnosis found.    No orders of the defined types were placed in this encounter.     Assessment & Recommendations:   72 y.o. African-American female with hypertension, hyperlipidemia, type 2 diabetes mellitus, referred for syncope  Syncope: History is suggestive of possible component of dehydration or orthostatic hypotension.  Mild LVH, EF 65% (Echocardiogram 03/2023). 5 episodes of NSVT up to 6 beats noted on cardiac telemetry, no other significant arrhythmias. (03/2023). Patient and family are very concerned. In addition to increasing fluid intake and wearing compression stockings, it is reasonable to refer to EP for consideration for loop recorder or EP study.  In the meantime, I have cautioned the patient against  driving for 6 months since her last syncope episode.   Hypertension: Controlled.  Mixed hyperlipidemia: States that her cholesterol has been up-and-down, currently not on lipid-lowering agent. Will discuss lipid-lowering therapy further after her workup for syncope.  F/u in 3 months   Thank you for referring the patient to [place]. Please feel free to contact with any questions.   Elder Negus, MD Pager: [phone] Office: [phone]

## 2023-06-10 ENCOUNTER — Other Ambulatory Visit: Payer: Self-pay | Admitting: Allergy

## 2023-07-17 ENCOUNTER — Institutional Professional Consult (permissible substitution): Payer: Medicare Other | Admitting: Cardiovascular Disease

## 2023-08-14 ENCOUNTER — Ambulatory Visit: Payer: Medicare Other | Attending: Cardiovascular Disease | Admitting: Cardiovascular Disease

## 2023-08-14 ENCOUNTER — Encounter: Payer: Self-pay | Admitting: Cardiovascular Disease

## 2023-08-14 VITALS — BP 134/70 | HR 61 | Ht 63.0 in | Wt 165.8 lb

## 2023-08-14 DIAGNOSIS — I4729 Other ventricular tachycardia: Secondary | ICD-10-CM

## 2023-08-14 DIAGNOSIS — R55 Syncope and collapse: Secondary | ICD-10-CM | POA: Diagnosis not present

## 2023-08-14 NOTE — Patient Instructions (Signed)
Medication Instructions:  Your physician recommends that you continue on your current medications as directed. Please refer to the Current Medication list given to you today.  Labwork: None ordered.  Testing/Procedures: None ordered.  Follow-Up:  Your physician wants you to follow-up in: one year with Dr. Augustus Mealor.  You will receive a reminder letter in the mail two months in advance. If you don't receive a letter, please call our office to schedule the follow-up appointment.  Implantable Loop Recorder Placement, Care After This sheet gives you information about how to care for yourself after your procedure. Your health care provider may also give you more specific instructions. If you have problems or questions, contact your health care provider.  What can I expect after the procedure? After the procedure, it is common to have: Soreness or discomfort near the incision. Some swelling or bruising near the incision.  Follow these instructions at home: Incision care  Monitor your cardiac device site for redness, swelling, and drainage. Call the device clinic at [phone] if you experience these symptoms or fever/chills.  Keep the large square bandage on your site for 24 hours and then you may remove it yourself. Keep the steri-strips underneath in place.   You may shower after 72 hours / 3 days from your procedure with the steri-strips in place. They will usually fall off on their own, or may be removed after 10 days. Pat dry.   Avoid lotions, ointments, or perfumes over your incision until it is well-healed.  Please do not submerge in water until your site is completely healed.   Your device is MRI compatible.   Remote monitoring is used to monitor your cardiac device from home. This monitoring is scheduled every month by our office. It allows us to keep an eye on the function of your device to ensure it is working properly.  If your wound site starts to bleed apply  pressure.    For help with the monitor please call [employer] Monitor Support Specialist directly at [phone].    If you have any questions/concerns please call the device clinic at [phone].  Activity  Return to your normal activities.  General instructions Follow instructions from your health care provider about how to manage your implantable loop recorder and transmit the information. Learn how to activate a recording if this is necessary for your type of device. You may go through a metal detection gate, and you may let someone hold a metal detector over your chest. Show your ID card if needed. Do not have an MRI unless you check with your health care provider first. Take over-the-counter and prescription medicines only as told by your health care provider. Keep all follow-up visits as told by your health care provider. This is important. Contact a health care provider if: You have redness, swelling, or pain around your incision. You have a fever. You have pain that is not relieved by your pain medicine. You have triggered your device because of fainting (syncope) or because of a heartbeat that feels like it is racing, slow, fluttering, or skipping (palpitations). Get help right away if you have: Chest pain. Difficulty breathing. Summary After the procedure, it is common to have soreness or discomfort near the incision. Change your dressing as told by your health care provider. Follow instructions from your health care provider about how to manage your implantable loop recorder and transmit the information. Keep all follow-up visits as told by your health care provider. This is important. This information   is not intended to replace advice given to you by your health care provider. Make sure you discuss any questions you have with your health care provider. Document Released: 08/30/2015 Document Revised: 11/03/2017 Document Reviewed: 11/03/2017 Elsevier Patient Education   2020 [employer] Inc[employer]  

## 2023-08-14 NOTE — Progress Notes (Signed)
Electrophysiology Office Note:    Date:  08/14/2023   ID:  PAGE Sandra Mccoy, DOB 09/12/1951, MRN [id number]  PCP:  [name], MD   [facility] Providers Cardiologist:  None     Referring MD: Elder Negus, MD   History of Present Illness:    Sandra Mccoy is a 72 y.o. female with a medical history significant for HTN, HLD, referred for syncope and NSVT.     Discussed the use of AI scribe software for clinical note transcription with the patient, who gave verbal consent to proceed.  presents for an initial consult due to episodes of syncope. The patient has experienced at least three episodes of syncope since January. The most recent episode occurred in July during a family reunion. The patient was moving around in hot weather and had not eaten anything. The patient felt dizzy and lost consciousness for a short period of time. The patient also experienced two other episodes of syncope, one in April and another one prior to that. Both episodes occurred after the patient stood up. The first episode was attributed to orthostatic hypotension or dehydration and the patient felt better after receiving fluids. The patient has been taking it easy and planning movements more strategically since the last episode.      Today, she reports that she is doing well.  EKGs/Labs/Other Studies Reviewed Today:     Echocardiogram:  TTE March 22, 2023 EF 65%.  Mild concentric LVH.   Monitors:  Zio Patch -- 14 days 03/2023 -- my interpretation Sinus rhythm 50-143, avg 84 5 NSVT episodes, longest 6 beats No symptoms reported     EKG:         Physical Exam:    VS:  BP 134/70 (BP Location: Left Arm, Patient Position: Sitting, Cuff Size: Normal)   Pulse 61   Ht 5\' 3"  (1.6 m)   Wt 165 lb 12.8 oz (75.2 kg)   SpO2 98%   BMI 29.37 kg/m     Wt Readings from Last 3 Encounters:  08/14/23 165 lb 12.8 oz (75.2 kg)  05/23/23 164 lb (74.4 kg)  04/18/23 166 lb 6.4 oz (75.5 kg)      GEN:  Well nourished, well developed in no acute distress CARDIAC: RRR, no murmurs, rubs, gallops RESPIRATORY:  Normal work of breathing MUSCULOSKELETAL: no edema    ASSESSMENT & PLAN:     Syncope Suspect orthostatic hypotension, perhaps some neurocardiogenic component NSVT noted on monitor Due to the recurrence of syncope and abnormal monitor, I think placement of a loop recorder is reasonable We discussed the procedure. I explained the monthly monitoring fee and risks including discomfort, minor infection and minor bleeding  NSVT Very brief runs, asymptomatic Will monitor  Hypertension BP reasonably controlled today. No changes.    LOOP IMPLANT PROCEDURE  SURGEON:  Maurice Small, MD     PREPROCEDURE DIAGNOSIS:  Syncope    POSTPROCEDURE DIAGNOSIS: Syncope     PROCEDURES:   1. Implantable loop recorder implantation    INTRODUCTION:  Sandra Mccoy presents with a history of syncope The costs of loop recorder monitoring have been discussed with the patient.    DESCRIPTION OF PROCEDURE:  Informed written consent was obtained.  A timeout was performed. The patient required no sedation for the procedure today. The patients left chest was prepped and draped in the usual sterile fashion. The skin overlying the left parasternal region was infiltrated with lidocaine for local analgesia.  A 0.5-cm incision  was made over the left parasternal region over the 3rd intercostal space.  A subcutaneous ILR pocket was fashioned using a combination of sharp and blunt dissection.  A Medtronic Reveal LINQ 2 implantable loop recorder (SN [id number]) was then placed into the pocket  R waves were very prominent and measured >0.69mV.  Steri- Strips and a sterile dressing were then applied.  There were no early apparent complications.     CONCLUSIONS:   1. Successful implantation of a implantable loop recorder for a history of recurrent syncope   2. No early apparent complications.   Maurice Small, MD  Cardiac Electrophysiology     Signed, Maurice Small, MD  08/14/2023 8:50 AM    [facility]

## 2023-08-23 ENCOUNTER — Ambulatory Visit: Payer: Medicare Other | Attending: Cardiology | Admitting: Cardiology

## 2023-08-23 ENCOUNTER — Encounter: Payer: Self-pay | Admitting: Cardiology

## 2023-08-23 VITALS — BP 136/66 | HR 66 | Resp 16 | Ht 63.0 in | Wt 167.4 lb

## 2023-08-23 DIAGNOSIS — I4729 Other ventricular tachycardia: Secondary | ICD-10-CM | POA: Diagnosis not present

## 2023-08-23 DIAGNOSIS — E782 Mixed hyperlipidemia: Secondary | ICD-10-CM | POA: Diagnosis not present

## 2023-08-23 DIAGNOSIS — R55 Syncope and collapse: Secondary | ICD-10-CM | POA: Diagnosis not present

## 2023-08-23 DIAGNOSIS — I10 Essential (primary) hypertension: Secondary | ICD-10-CM | POA: Diagnosis not present

## 2023-08-23 MED ORDER — ROSUVASTATIN CALCIUM 20 MG PO TABS
20.0000 mg | ORAL_TABLET | Freq: Every day | ORAL | 2 refills | Status: DC
Start: 2023-08-23 — End: 2024-02-19

## 2023-08-23 NOTE — Patient Instructions (Signed)
Medication Instructions:   START TAKING ROSUVASTATIN (CRESTOR) 20 MG BY MOUTH DAILY  *If you need a refill on your cardiac medications before your next appointment, please call your pharmacy*   Lab Work:  IN 3 MONTHS HERE IN THE OFFICE (AROUND 11/23/23)--CHECK LIPIDS--PLEASE COME FASTING TO THIS LAB APPOINTMENT  If you have labs (blood work) drawn today and your tests are completely normal, you will receive your results only by: MyChart Message (if you have MyChart) OR A paper copy in the mail If you have any lab test that is abnormal or we need to change your treatment, we will call you to review the results.     Follow-Up: At [facility], you and your health needs are our priority.  As part of our continuing mission to provide you with exceptional heart care, we have created designated Provider Care Teams.  These Care Teams include your primary Cardiologist (physician) and Advanced Practice Providers (APPs -  Physician Assistants and Nurse Practitioners) who all work together to provide you with the care you need, when you need it.  We recommend signing up for the patient portal called "MyChart".  Sign up information is provided on this After Visit Summary.  MyChart is used to connect with patients for Virtual Visits (Telemedicine).  Patients are able to view lab/test results, encounter notes, upcoming appointments, etc.  Non-urgent messages can be sent to your provider as well.   To learn more about what you can do with MyChart, go to [email or web address].    Your next appointment:   6 month(s)  Provider:   Jari Favre, PA-C, Ronie Spies, PA-C, Robin Searing, NP, Jacolyn Reedy, PA-C, Eligha Bridegroom, NP, Tereso Newcomer, PA-C, or Perlie Gold, PA-C

## 2023-08-23 NOTE — Progress Notes (Signed)
  Cardiology Office Note:  .   Date:  08/23/2023  ID:  Sandra Mccoy, DOB 1951-01-16, MRN [id number] PCP: [name], MD  [facility] Providers Cardiologist:  Truett Mainland, MD PCP: [name], MD  Chief Complaint  Patient presents with   Syncope and collapse   Follow-up           History of Present Illness: .    Sandra Mccoy is a 72 y.o. female with hypertension, hyperlipidemia, type 2 diabetes mellitus, syncope, brief NSVT  Since her last visit with me, she underwent loop recorder placement by Dr. Nelly Laurence.  He has not had recurrent syncope episodes.  She is trying to drink more water. She denies chest pain, shortness of breath, palpitations, leg edema, orthopnea, PND, TIA.    Vitals:   08/23/23 1317  BP: 136/66  Pulse: 66  Resp: 16  SpO2: 96%     ROS:  Review of Systems  Cardiovascular:  Negative for chest pain, dyspnea on exertion, leg swelling, palpitations and syncope.     Studies Reviewed: Marland Kitchen       Independently interpreted Labs 03/08/2023: Chol 223, TG 119, HDL 63, LDL 139    Physical Exam:   Physical Exam Vitals and nursing note reviewed.  Constitutional:      General: She is not in acute distress. Neck:     Vascular: No JVD.  Cardiovascular:     Rate and Rhythm: Normal rate and regular rhythm.     Heart sounds: Normal heart sounds. No murmur heard. Pulmonary:     Effort: Pulmonary effort is normal.     Breath sounds: Normal breath sounds. No wheezing or rales.  Musculoskeletal:     Right lower leg: No edema.     Left lower leg: No edema.      VISIT DIAGNOSES:   ICD-10-CM   1. Syncope and collapse  R55     2. NSVT (nonsustained ventricular tachycardia) (HCC)  I47.29     3. Mixed hyperlipidemia  E78.2 rosuvastatin (CRESTOR) 20 MG tablet    Lipid Profile    Lipid Profile    4. Essential hypertension  I10        ASSESSMENT AND PLAN: .    Sandra Mccoy is a 72 y.o. female with hypertension, hyperlipidemia, type 2  diabetes mellitus, referred for syncope   Syncope: No recent recurrence. 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 Patient now has loop recorder with no significant alerts so far.     Hypertension: Controlled.   Mixed hyperlipidemia: Uncontrolled.  Especially in light of known diabetes, strongly recommend statin. Started Crestor 20 mg daily, continue Zetia 10 mg daily. Repeat lipid panel in 3 months.     Meds ordered this encounter  Medications   rosuvastatin (CRESTOR) 20 MG tablet    Sig: Take 1 tablet (20 mg total) by mouth daily.    Dispense:  90 tablet    Refill:  2     F/u in 6 months w/APP  Signed, Elder Negus, MD

## 2023-09-06 ENCOUNTER — Other Ambulatory Visit: Payer: Self-pay | Admitting: Family Medicine

## 2023-09-06 DIAGNOSIS — M8588 Other specified disorders of bone density and structure, other site: Secondary | ICD-10-CM

## 2023-09-18 ENCOUNTER — Ambulatory Visit: Payer: Medicare Other

## 2023-09-18 DIAGNOSIS — R55 Syncope and collapse: Secondary | ICD-10-CM | POA: Diagnosis not present

## 2023-09-19 LAB — CUP PACEART REMOTE DEVICE CHECK
Date Time Interrogation Session: 20241217141812
Implantable Pulse Generator Implant Date: 20241112

## 2023-10-19 ENCOUNTER — Encounter: Payer: Self-pay | Admitting: Allergy

## 2023-10-19 ENCOUNTER — Other Ambulatory Visit: Payer: Self-pay

## 2023-10-19 ENCOUNTER — Ambulatory Visit (INDEPENDENT_AMBULATORY_CARE_PROVIDER_SITE_OTHER): Payer: Medicare Other | Admitting: Allergy

## 2023-10-19 VITALS — BP 110/50 | HR 70 | Temp 98.2°F | Ht 63.0 in | Wt 162.6 lb

## 2023-10-19 DIAGNOSIS — H1013 Acute atopic conjunctivitis, bilateral: Secondary | ICD-10-CM

## 2023-10-19 DIAGNOSIS — J302 Other seasonal allergic rhinitis: Secondary | ICD-10-CM | POA: Diagnosis not present

## 2023-10-19 DIAGNOSIS — J3089 Other allergic rhinitis: Secondary | ICD-10-CM | POA: Diagnosis not present

## 2023-10-19 NOTE — Progress Notes (Signed)
Follow-up Note  RE: Sandra Mccoy MRN: [id number] DOB: 1951/04/02 Date of Office Visit: 10/19/2023   History of present illness: Sandra Mccoy is a 73 y.o. female presenting today for follow-up of allergic rhinitis with conjunctivitis.  She was last seen in the office on 04/18/2023 by myself.    Discussed the use of AI scribe software for clinical note transcription with the patient, who gave verbal consent to proceed.  The patient, with a history of allergies, presents with concerns about nosebleeds potentially caused by her two nasal sprays, Astepro (azelastine) and Flonase (fluticasone). She admits to struggling with the correct technique for nasal spray administration. The nosebleeds are suspected to be a side effect of the Flonase, which is used for congestion control.  She also reports that her eyes have been running, which is managed with Bepreve eye drops.  She noticed a significant increase in eye watering when she forgot to use the drops one day.  The patient has not experienced any major health changes, new diagnoses, surgeries, or hospitalizations since her last visit in July. She has received her flu and COVID vaccines, as well as the RSV vaccine due to exposure risk to/from her grandson.  The patient has not felt worse by not using the Flonase and is willing to try using it less frequently. She is aware that her symptoms may worsen with the upcoming pollen season. She also has saline spray to use on the days she is not using Flonase to keep her nasal passages moisturized and reduce the risk of nosebleeds.     Review of systems: 10pt ROS negative unless noted above in HPI  All other systems negative unless noted above in HPI  Past medical/social/surgical/family history have been reviewed and are unchanged unless specifically indicated below.  No changes  Medication List: Current Outpatient Medications  Medication Sig Dispense Refill   Bepotastine Besilate 1.5 % SOLN Place 1  drop into both eyes 2 (two) times daily as needed (itchy/watery eyes). 10 mL 5   clobetasol ointment (TEMOVATE) 0.05 % Apply 1 application topically 2 (two) times daily as needed (eczema).     desonide (DESOWEN) 0.05 % cream Apply 1 Application topically 2 (two) times daily.     diltiazem (CARDIZEM LA) 120 MG 24 hr tablet Take 120 mg by mouth daily.     ezetimibe (ZETIA) 10 MG tablet Take 10 mg by mouth every morning.     furosemide (LASIX) 20 MG tablet Take 1 tablet (20 mg total) by mouth daily. 90 tablet 3   GARLIC PO Take 1 tablet by mouth at bedtime.     Ginger, Zingiber officinalis, (GINGER PO) Take 1 tablet by mouth at bedtime.     hydrOXYzine (ATARAX) 10 MG tablet Take 1 tablet (10 mg total) by mouth 3 (three) times daily as needed for itching. 30 tablet 0   levocetirizine (XYZAL) 5 MG tablet TAKE 1 TABLET BY MOUTH EVERY DAY IN THE EVENING 30 tablet 5   levothyroxine (SYNTHROID) 100 MCG tablet Take 100 mcg by mouth every morning.     Multiple Vitamin (MULTIVITAMIN WITH MINERALS) TABS tablet Take 1 tablet by mouth every morning.     olmesartan (BENICAR) 40 MG tablet Take 1 tablet (40 mg total) by mouth daily. (Patient taking differently: Take 20 mg by mouth daily.) 90 tablet 3   rosuvastatin (CRESTOR) 20 MG tablet Take 1 tablet (20 mg total) by mouth daily. 90 tablet 2   triamcinolone cream (KENALOG) 0.1 %  Apply 1 Application topically 2 (two) times daily.     azelastine (ASTELIN) 0.1 % nasal spray Place 2 sprays into both nostrils 2 (two) times daily. (Patient not taking: Reported on 10/19/2023) 30 mL 5   fluticasone (FLONASE) 50 MCG/ACT nasal spray Place 2 sprays into both nostrils 2 (two) times daily as needed for allergies or rhinitis. (Patient not taking: Reported on 10/19/2023) 16 g 5   No current facility-administered medications for this visit.     Known medication allergies: Allergies  Allergen Reactions   Lipitor [Atorvastatin] Nausea And Vomiting and Other (See Comments)     Made her loopy   Simvastatin Other (See Comments)    Increased LFT's     Physical examination: Blood pressure (!) 110/50, pulse 70, temperature 98.2 F (36.8 C), height 5\' 3"  (1.6 m), weight 162 lb 9.6 oz (73.8 kg), SpO2 100%.  General: Alert, interactive, in no acute distress. HEENT: PERRLA, TMs pearly gray, turbinates minimally edematous without discharge, post-pharynx non erythematous. Neck: Supple without lymphadenopathy. Lungs: Clear to auscultation without wheezing, rhonchi or rales. {no increased work of breathing. CV: Normal S1, S2 without murmurs. Abdomen: Nondistended, nontender. Skin: Warm and dry, without lesions or rashes. Extremities:  No clubbing, cyanosis or edema. Neuro:   Grossly intact.  Diagnositics/Labs: None today  Assessment and plan: Allergic rhinitis with conjunctivitis Epistaxis  - continue avoidance measures for grasses, weeds, trees, indoor molds, outdoor molds, dust mites, and cat. - Continue taking:  Xyzal (levocetirizine) 5mg  tablet once daily.   Astepro (azelastine) 2 sprays per nostril 2 times daily for drainage.  Flonase (fluticasone) 1-2 sprays per nostril 3 days (M, W, F) for congestion control.   With using nasal sprays point tip of bottle toward eye on same side nostril and lean head slightly forward for best technique.   Bepreve one drop per eye twice a day as needed for itchy/watery eyes.  - You can use an extra dose of the antihistamine, if needed, for breakthrough symptoms.  - Recommend use of nasal saline spray periodically in the week to help moisturize the nose.  Consider nasal saline rinses 1-2 times daily to remove allergens from the nasal cavities as well as help with mucous clearance (this is especially helpful to do before the nasal sprays are given) - Allergy shots "re-train" and "reset" the immune system to ignore environmental allergens and decrease the resulting immune response to those allergens (sneezing, itchy watery eyes,  runny nose, nasal congestion, etc).    - Allergy shots improve symptoms in 75-85% of patients.  - We can discuss more at a future appointment if the medications are not working for you.   Follow-up in 6 months or sooner if needed  I appreciate the opportunity to take part in Sandra Mccoy's care. Please do not hesitate to contact me with questions.  Sincerely,   Margo Aye, MD Allergy/Immunology [facility]

## 2023-10-19 NOTE — Patient Instructions (Addendum)
-   continue avoidance measures for grasses, weeds, trees, indoor molds, outdoor molds, dust mites, and cat. - Continue taking:  Xyzal (levocetirizine) 5mg  tablet once daily.   Astepro (azelastine) 2 sprays per nostril 2 times daily for drainage.  Flonase (fluticasone) 1-2 sprays per nostril 3 days (M, W, F) for congestion control.   With using nasal sprays point tip of bottle toward eye on same side nostril and lean head slightly forward for best technique.   Bepreve one drop per eye twice a day as needed for itchy/watery eyes.  - You can use an extra dose of the antihistamine, if needed, for breakthrough symptoms.  - Recommend use of nasal saline spray periodically in the week to help moisturize the nose.  Consider nasal saline rinses 1-2 times daily to remove allergens from the nasal cavities as well as help with mucous clearance (this is especially helpful to do before the nasal sprays are given) - Allergy shots "re-train" and "reset" the immune system to ignore environmental allergens and decrease the resulting immune response to those allergens (sneezing, itchy watery eyes, runny nose, nasal congestion, etc).    - Allergy shots improve symptoms in 75-85% of patients.  - We can discuss more at a future appointment if the medications are not working for you.   Follow-up in 6 months or sooner if needed

## 2023-10-23 ENCOUNTER — Ambulatory Visit (INDEPENDENT_AMBULATORY_CARE_PROVIDER_SITE_OTHER): Payer: Medicare Other

## 2023-10-23 DIAGNOSIS — R55 Syncope and collapse: Secondary | ICD-10-CM

## 2023-10-24 LAB — CUP PACEART REMOTE DEVICE CHECK
Date Time Interrogation Session: 20250121141813
Implantable Pulse Generator Implant Date: 20241112

## 2023-10-25 NOTE — Progress Notes (Signed)
Carelink Summary Report / Loop Recorder 

## 2023-10-25 NOTE — Addendum Note (Signed)
Addended by: Geralyn Flash D on: 10/25/2023 04:52 PM   Modules accepted: Orders

## 2023-10-29 ENCOUNTER — Encounter: Payer: Self-pay | Admitting: Cardiovascular Disease

## 2023-11-19 ENCOUNTER — Other Ambulatory Visit: Payer: Self-pay

## 2023-11-19 MED ORDER — LEVOCETIRIZINE DIHYDROCHLORIDE 5 MG PO TABS: 5.0000 mg | ORAL_TABLET | Freq: Every day | ORAL | 5 refills | Status: DC

## 2023-11-26 ENCOUNTER — Telehealth: Payer: Self-pay | Admitting: Allergy

## 2023-11-26 NOTE — Telephone Encounter (Signed)
 I called patient and left a vm to call the office back. Xyzal was sent in 11/19/23 with 5 refills to [facility] on [address] in [place].

## 2023-11-26 NOTE — Telephone Encounter (Signed)
 Patient called stating she needs a refill on Xyzal. Patient's pharmacy is [facility] on [address] in [place].

## 2023-11-27 ENCOUNTER — Ambulatory Visit (INDEPENDENT_AMBULATORY_CARE_PROVIDER_SITE_OTHER): Payer: Medicare Other

## 2023-11-27 DIAGNOSIS — R55 Syncope and collapse: Secondary | ICD-10-CM | POA: Diagnosis not present

## 2023-11-27 NOTE — Telephone Encounter (Signed)
 I called patient and left a message to call the office back.

## 2023-11-28 LAB — CUP PACEART REMOTE DEVICE CHECK
Date Time Interrogation Session: 20250225141813
Implantable Pulse Generator Implant Date: 20241112

## 2023-11-29 NOTE — Telephone Encounter (Signed)
 I called patient and left a message to call the office back in regards to her refills

## 2023-11-29 NOTE — Telephone Encounter (Signed)
 I called patient and left a message to call the office back.

## 2023-12-03 NOTE — Addendum Note (Signed)
 Addended by: Geralyn Flash D on: 12/03/2023 02:48 PM   Modules accepted: Orders

## 2023-12-03 NOTE — Progress Notes (Signed)
 Carelink Summary Report / Loop Recorder

## 2023-12-04 ENCOUNTER — Encounter: Payer: Self-pay | Admitting: Cardiovascular Disease

## 2023-12-06 ENCOUNTER — Encounter: Payer: Self-pay | Admitting: Neurology

## 2023-12-13 NOTE — Progress Notes (Signed)
 Assessment/Plan:   Tremor  -No evidence of a neurodegenerative process.  -Certainly could represent essential tremor, but this is only been going on for 6 to 12 weeks.  In order to diagnose essential tremor, one needs to have tremor for 3 years.  There is nothing in the characteristic of her tremor that particularly makes me concerned.  -We talked about the fact that when she had lab work in December thyroid was being overtreated, which is why her primary care physician decreased her medication.  That overtreatment could have generated some tremor back then.  Her TSH was just rechecked and we called during the visit and were able to get a copy of that and it has recently normalized.  -At this point, patient is not interested in medications, which I wholeheartedly agree with.  I think that risks outweigh benefits right now.  Certainly, we discussed if things she get worse or should new neurologic issues arise, I would want to see her back.  Otherwise, she will follow-up on an as-needed basis.  Subjective:   KIONI STAHL was seen today in the movement disorders clinic for neurologic consultation at the request of [name], MD.  The consultation is for the evaluation of tremor.Outside records that were made available to me were reviewed.  Primary care noted "mild action tremor."  She was sent for further evaluation.  Tremor:    How long has it been going on? 6-12 weeks that she has noted it but she notes today that family thinks it has been going on longer than this   At rest or with activation?  Activation  When is it noted the most?  She is unsure  Fam hx of tremor?  No.  Located where?  Bilateral UE  Affected by caffeine:  No.  Affected by alcohol: doesn't drink alcohol  Affected by stress:  No.  Affected by fatigue:  No.  Spills soup if on spoon:  No.  Spills glass of liquid if full:  No.  Affects ADL's (tying shoes, brushing teeth, etc):  No.   Other Specific Symptoms:  Voice:  comes and goes some but sings at church choir okay Postural symptoms:  Yes.   - but she does well if she "takes her time"  Falls?  Last fall was 3-4 years ago Bradykinesia symptoms: difficulty getting out of a chair ("I can do it but I have to take my time"); no shuffling Loss of smell:  some  Loss of taste:  No. Difficulty Swallowing:  No. Handwriting, micrographia: No. Trouble with ADL's:  No.  Trouble buttoning clothing: No. Depression:  No. N/V:  No. Lightheaded:  No.  Syncope: had episode about a year ago but none since Diplopia:  No.   Neuroimaging of the brain has ot previously been performed.  She had a CT brain in 10/2022 and it was unremarkable  ALLERGIES:   Allergies  Allergen Reactions   Lipitor [Atorvastatin] Nausea And Vomiting and Other (See Comments)    Made her loopy   Simvastatin Other (See Comments)    Increased LFT's    CURRENT MEDICATIONS:  Current Outpatient Medications  Medication Instructions   Bepotastine Besilate 1.5 % SOLN 1 drop, Both Eyes, 2 times daily PRN   clobetasol ointment (TEMOVATE) 0.05 % 1 application , 2 times daily PRN   desonide (DESOWEN) 0.05 % cream 1 Application, 2 times daily   diltiazem (CARDIZEM LA) 120 mg, Daily   ezetimibe (ZETIA) 10 mg, Every morning  fluticasone (FLONASE) 50 MCG/ACT nasal spray 2 sprays, Each Nare, 2 times daily PRN   furosemide (LASIX) 20 mg, Oral, Daily   GARLIC PO 1 tablet, Daily at bedtime   Ginger, Zingiber officinalis, (GINGER PO) 1 tablet, Daily at bedtime   hydrOXYzine (ATARAX) 10 mg, Oral, 3 times daily PRN   levocetirizine (XYZAL) 5 mg, Oral, Daily   levothyroxine (SYNTHROID) 100 mcg, Every morning   Multiple Vitamin (MULTIVITAMIN WITH MINERALS) TABS tablet 1 tablet, Every morning   olmesartan (BENICAR) 40 mg, Oral, Daily   rosuvastatin (CRESTOR) 20 mg, Oral, Daily   triamcinolone cream (KENALOG) 0.1 % 1 Application, 2 times daily    Objective:   PHYSICAL EXAMINATION:    VITALS:    Vitals:   12/17/23 1012  BP: 122/72  Pulse: 60  SpO2: 98%  Weight: 164 lb 12.8 oz (74.8 kg)  Height: 5\' 3"  (1.6 m)    GEN:  The patient appears stated age and is in NAD. HEENT:  Normocephalic, atraumatic.  The mucous membranes are moist. The superficial temporal arteries are without ropiness or tenderness. CV:  RRR Lungs:  CTAB Neck/HEME:  There are no carotid bruits bilaterally.  Neurological examination:  Orientation: The patient is alert and oriented x3.  Cranial nerves: There is good facial symmetry.  Extraocular muscles are intact. The visual fields are full to confrontational testing. The speech is fluent and clear. Soft palate rises symmetrically and there is no tongue deviation. Hearing is intact to conversational tone. Sensation: Sensation is intact to light touch throughout (facial, trunk, extremities). Vibration is intact at the bilateral big toe. There is no extinction with double simultaneous stimulation.  Motor: Strength is 5/5 in the bilateral upper and lower extremities.   Shoulder shrug is equal and symmetric.  There is no pronator drift. Deep tendon reflexes: Deep tendon reflexes are 2/4 at the bilateral biceps, triceps, brachioradialis, patella and achilles. Plantar responses are downgoing bilaterally.  Movement examination: Tone: There is normal tone in the bilateral upper extremities.  The tone in the lower extremities is normal.  Abnormal movements: There is no rest tremor.  There is no postural tremor.  There is rare intention tremor bilaterally.  Tremor is seen when she is pouring water from 1 glass to another, but it is overall mild.  Archimedes spirals are drawn well.  Coordination:  There is no decremation with RAM's, with any form of RAMS, including alternating supination and pronation of the forearm, hand opening and closing, finger taps, heel taps and toe taps.  Gait and Station: The patient has no difficulty arising out of a deep-seated chair without the  use of the hands. The patient's stride length is good.    I have reviewed and interpreted the following labs independently   Chemistry      Component Value Date/Time   NA 139 01/24/2023 1616   K 4.1 01/24/2023 1616   CL 104 01/24/2023 1616   CO2 25 01/24/2023 1616   BUN 22 01/24/2023 1616   CREATININE 0.91 01/24/2023 1616      Component Value Date/Time   CALCIUM 10.2 01/24/2023 1616   ALKPHOS 63 10/09/2022 1509   AST 21 10/09/2022 1509   ALT 21 10/09/2022 1509   BILITOT 0.5 10/09/2022 1509      No results found for: "TSH" Lab Results  Component Value Date   WBC 10.1 01/24/2023   HGB 13.6 01/24/2023   HCT 40.5 01/24/2023   MCV 95.1 01/24/2023   PLT 206 01/24/2023  Labs were just done with primary care physician September 06, 2023.  Hemoglobin A1c 7.1. Sodium was 140, potassium 4.5, chloride 102, CO2 31, BUN 19, creatinine 0.95, glucose 105, AST 21, ALT 21, TSH 0.15.  This was rechecked recently after decreasing her levothyroxine.  We did call the office to get this and got 12/11/23 labs but no tsh was initially sent.  I was able to get a copy when we called back and this was done December 04, 2023 and was 3.47.  Total time spent on today's visit was 60 minutes, including both face-to-face time and nonface-to-face time.  Time included that spent on review of records (prior notes available to me/labs/imaging if pertinent), discussing treatment and goals, answering patient's questions and coordinating care.  Cc:  [name], MD

## 2023-12-17 ENCOUNTER — Encounter: Payer: Self-pay | Admitting: Neurology

## 2023-12-17 ENCOUNTER — Ambulatory Visit (INDEPENDENT_AMBULATORY_CARE_PROVIDER_SITE_OTHER): Admitting: Neurology

## 2023-12-17 VITALS — BP 122/72 | HR 60 | Ht 63.0 in | Wt 164.8 lb

## 2023-12-17 DIAGNOSIS — E039 Hypothyroidism, unspecified: Secondary | ICD-10-CM

## 2023-12-17 DIAGNOSIS — R251 Tremor, unspecified: Secondary | ICD-10-CM | POA: Diagnosis not present

## 2023-12-17 NOTE — Patient Instructions (Signed)
 It was my pleasure to see you today.  We discussed that if tremor gets worse, we can add medication.  The physicians and staff at [facility] are committed to providing excellent care. You may receive a survey requesting feedback about your experience at our office. We strive to receive "very good" responses to the survey questions. If you feel that your experience would prevent you from giving the office a "very good " response, please contact our office to try to remedy the situation. We may be reached at [phone]. Thank you for taking the time out of your busy day to complete the survey.

## 2023-12-31 ENCOUNTER — Ambulatory Visit (INDEPENDENT_AMBULATORY_CARE_PROVIDER_SITE_OTHER): Payer: Medicare Other | Admitting: Dermatology

## 2023-12-31 ENCOUNTER — Encounter: Payer: Self-pay | Admitting: Dermatology

## 2023-12-31 VITALS — BP 113/70 | HR 75

## 2023-12-31 DIAGNOSIS — L308 Other specified dermatitis: Secondary | ICD-10-CM

## 2023-12-31 DIAGNOSIS — D485 Neoplasm of uncertain behavior of skin: Secondary | ICD-10-CM

## 2023-12-31 DIAGNOSIS — D492 Neoplasm of unspecified behavior of bone, soft tissue, and skin: Secondary | ICD-10-CM | POA: Diagnosis not present

## 2023-12-31 DIAGNOSIS — L309 Dermatitis, unspecified: Secondary | ICD-10-CM | POA: Diagnosis not present

## 2023-12-31 MED ORDER — ZORYVE 0.3 % EX CREA
1.0000 | TOPICAL_CREAM | Freq: Two times a day (BID) | CUTANEOUS | 5 refills | Status: DC
Start: 2023-12-31 — End: 2024-03-12

## 2023-12-31 NOTE — Patient Instructions (Addendum)
 Hello Sandra Mccoy,  Thank you for visiting today. Here is a summary of the key instructions:  - Medications:   - Apply Zoryve cream once a day to all affected areas   - Use CeraVe Anti-Itch Moisturizer as needed  - Procedures:   - A 4-millimeter punch biopsy will be performed on your back today  - Follow-up:   - Return in 2 weeks to have sutures removed   - At the follow-up visit, we will decide on an oral medicine or injectable treatment  - Pharmacy:   - Prescription for [facility] will be sent to [facility] will call you to verify insurance coverage   - If covered, they will mail the medication to you   - If not covered, contact our office for an alternative  - Instructions:   - Stop using steroid creams to give your skin a break   - If [facility] says the medication is not covered, call or message our office  Please reach out if you have any questions or concerns.  Warm regards,  Dr. Langston Reusing, Dermatology    Patient Handout: Wound Care for Skin Biopsy Site  Taking Care of Your Skin Biopsy Site  Proper care of the biopsy site is essential for promoting healing and minimizing scarring. This handout provides instructions on how to care for your biopsy site to ensure optimal recovery.  1. Cleaning the Wound:  Clean the biopsy site daily with gentle soap and water. Gently pat the area dry with a clean, soft towel. Avoid harsh scrubbing or rubbing the area, as this can irritate the skin and delay healing.  2. Applying Aquaphor and Bandage:  After cleaning the wound, apply a thin layer of Aquaphor ointment to the biopsy site. Cover the area with a sterile bandage to protect it from dirt, bacteria, and friction. Change the bandage daily or as needed if it becomes soiled or wet.  3. Continued Care for One Week:  Repeat the cleaning, Aquaphor application, and bandaging process daily for one week following the biopsy procedure. Keeping the wound  clean and moist during this initial healing period will help prevent infection and promote optimal healing.  4. Massaging Aquaphor into the Area:  ---After one week, discontinue the use of bandages but continue to apply Aquaphor to the biopsy site. ----Gently massage the Aquaphor into the area using circular motions. ---Massaging the skin helps to promote circulation and prevent the formation of scar tissue.   Additional Tips:  Avoid exposing the biopsy site to direct sunlight during the healing process, as this can cause hyperpigmentation or worsen scarring. If you experience any signs of infection, such as increased redness, swelling, warmth, or drainage from the wound, contact your healthcare provider immediately. Follow any additional instructions provided by your healthcare provider for caring for the biopsy site and managing any discomfort. Conclusion:  Taking proper care of your skin biopsy site is crucial for ensuring optimal healing and minimizing scarring. By following these instructions for cleaning, applying Aquaphor, and massaging the area, you can promote a smooth and successful recovery. If you have any questions or concerns about caring for your biopsy site, don't hesitate to contact your healthcare provider for guidance.     Important Information   Due to recent changes in healthcare laws, you may see results of your pathology and/or laboratory studies on MyChart before the doctors have had a chance to review them. We understand that in some cases  there may be results that are confusing or concerning to you. Please understand that not all results are received at the same time and often the doctors may need to interpret multiple results in order to provide you with the best plan of care or course of treatment. Therefore, we ask that you please give [place] 2 business days to thoroughly review all your results before contacting the office for clarification. Should we see a critical lab  result, you will be contacted sooner.     If You Need Anything After Your Visit   If you have any questions or concerns for your doctor, please call our main line at [phone]. If no one answers, please leave a voicemail as directed and we will return your call as soon as possible. Messages left after 4 pm will be answered the following business day.    You may also send [place] a message via MyChart. We typically respond to MyChart messages within 1-2 business days.  For prescription refills, please ask your pharmacy to contact our office. Our fax number is [phone].  If you have an urgent issue when the clinic is closed that cannot wait until the next business day, you can page your doctor at the number below.     Please note that while we do our best to be available for urgent issues outside of office hours, we are not available 24/7.    If you have an urgent issue and are unable to reach [place], you may choose to seek medical care at your doctor's office, retail clinic, urgent care center, or emergency room.   If you have a medical emergency, please immediately call 911 or go to the emergency department. In the event of inclement weather, please call our main line at [phone] for an update on the status of any delays or closures.  Dermatology Medication Tips: Please keep the boxes that topical medications come in in order to help keep track of the instructions about where and how to use these. Pharmacies typically print the medication instructions only on the boxes and not directly on the medication tubes.   If your medication is too expensive, please contact our office at [phone] or send [place] a message through MyChart.    We are unable to tell what your co-pay for medications will be in advance as this is different depending on your insurance coverage. However, we may be able to find a substitute medication at lower cost or fill out paperwork to get insurance to cover a needed  medication.    If a prior authorization is required to get your medication covered by your insurance company, please allow [place] 1-2 business days to complete this process.   Drug prices often vary depending on where the prescription is filled and some pharmacies may offer cheaper prices.   The website www.goodrx.com contains coupons for medications through different pharmacies. The prices here do not account for what the cost may be with help from insurance (it may be cheaper with your insurance), but the website can give you the price if you did not use any insurance.  - You can print the associated coupon and take it with your prescription to the pharmacy.  - You may also stop by our office during regular business hours and pick up a GoodRx coupon card.  - If you need your prescription sent electronically to a different pharmacy, notify our office through [facility] or by phone at [phone]

## 2023-12-31 NOTE — Progress Notes (Signed)
   New Patient Visit   Subjective  Sandra Mccoy is a 73 y.o. female who presents for the following: Skin irritation  Patient states she has Eczema located at the scalp, face, elbow, and back that she would like to have examined. Patient reports the areas have been there for the majority of her life. She reports the areas are bothersome. Patient states the areas are extremely itchy. Patient rates irritation 9 out of 10. She states that the areas have not spread. Patient reports she has previously been treated for these areas. Patient has tried and failed Clobetasol, TMC and Desonide. She has also tired otc products such as Cerave Anti-itch, Aquaphor, Non-scented soaps and detergents with no relief.  The following portions of the chart were reviewed this encounter and updated as appropriate: medications, allergies, medical history  Review of Systems:  No other skin or systemic complaints except as noted in HPI or Assessment and Plan.  Objective  Well appearing patient in no apparent distress; mood and affect are within normal limits.  A full examination was performed including scalp, head, eyes, ears, nose, lips, neck, chest, axillae, abdomen, back, buttocks, bilateral upper extremities, bilateral lower extremities, hands, feet, fingers, toes, fingernails, and toenails. All findings within normal limits unless otherwise noted below.   Relevant exam findings are noted in the Assessment and Plan.                    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 It is often associated with allergic rhinitis and/or asthma and can require treatment with topical medications, phototherapy, or in severe cases biologic injectable medication (Dupixent;  Adbry) or Oral JAK inhibitors.  Treatment Plan: - Recommend gentle skin care - We will prescribe Zoryve 0.3% to apply to the effected areas throughout the body including the face - Recommended washing hair with Creave Zinc Shampoo or DHS Zinc Shampoo - We will follow up in 2 weeks to go over Biopsy results and Suture removal  PSORIASIFORM ECZEMA   NEOPLASM OF UNCERTAIN BEHAVIOR OF SKIN Left Upper Back Skin / nail biopsy Type of biopsy: punch   Informed consent: discussed and consent obtained   Timeout: patient name, date of birth, surgical site, and procedure verified   Procedure prep:  Patient was prepped and draped in usual sterile fashion Prep type:  Isopropyl alcohol Anesthesia: the lesion was anesthetized in a standard fashion   Anesthetic:  1% lidocaine w/ epinephrine 1-100,000 buffered w/ 8.4% NaHCO3 Punch size:  4 mm Suture size:  3-0 Suture type: nylon   Suture removal (days):  14 Hemostasis achieved with: suture and aluminum chloride   Outcome: patient tolerated procedure well   Post-procedure details: sterile dressing applied and wound care instructions given   Dressing type: petrolatum gauze   Specimen 1 - Surgical pathology Differential Diagnosis: Psoriasis vs Eczema  Check Margins: No  Return in about 2 weeks (around 01/14/2024) for Bx result & Suture Removal F/U .  I, Nell Range, am acting as Neurosurgeon for [employer], DO.   Documentation: I have reviewed the above documentation for accuracy and completeness, and I agree with the above.  Langston Reusing, DO

## 2024-01-01 ENCOUNTER — Ambulatory Visit (INDEPENDENT_AMBULATORY_CARE_PROVIDER_SITE_OTHER): Payer: Medicare Other

## 2024-01-01 DIAGNOSIS — R55 Syncope and collapse: Secondary | ICD-10-CM | POA: Diagnosis not present

## 2024-01-01 LAB — SURGICAL PATHOLOGY

## 2024-01-02 LAB — CUP PACEART REMOTE DEVICE CHECK
Date Time Interrogation Session: 20250401141812
Implantable Pulse Generator Implant Date: 20241112

## 2024-01-02 NOTE — Progress Notes (Signed)
 Carelink Summary Report / Loop Recorder

## 2024-01-02 NOTE — Addendum Note (Signed)
 Addended by: Geralyn Flash D on: 01/02/2024 03:22 PM   Modules accepted: Orders

## 2024-01-02 NOTE — Progress Notes (Signed)
 Pt's bx results confirm a dx of Eczema.  She is currently on a topical therapy and we will discuss the result sin details at her f/u apt in 2 weeks.

## 2024-01-10 ENCOUNTER — Encounter: Payer: Self-pay | Admitting: Cardiovascular Disease

## 2024-01-14 ENCOUNTER — Ambulatory Visit (INDEPENDENT_AMBULATORY_CARE_PROVIDER_SITE_OTHER): Admitting: Dermatology

## 2024-01-14 ENCOUNTER — Encounter: Payer: Self-pay | Admitting: Dermatology

## 2024-01-14 VITALS — BP 123/74

## 2024-01-14 DIAGNOSIS — L209 Atopic dermatitis, unspecified: Secondary | ICD-10-CM | POA: Diagnosis not present

## 2024-01-14 DIAGNOSIS — L299 Pruritus, unspecified: Secondary | ICD-10-CM

## 2024-01-14 MED ORDER — FLUOCINOLONE ACETONIDE SCALP 0.01 % EX OIL
TOPICAL_OIL | CUTANEOUS | 2 refills | Status: DC
Start: 2024-01-14 — End: 2024-06-23

## 2024-01-14 NOTE — Progress Notes (Unsigned)
 Follow-Up Visit   Subjective  Sandra Mccoy is a 73 y.o. female who presents for the following: Bx Results & Suture removal  Patient present today for follow up visit. Patient was last evaluated on 12/31/23. At this visit patient was prescribed Zorvye 0.3% cream. Patient reports sxs are better. Patient denies medication changes.    The following portions of the chart were reviewed this encounter and updated as appropriate: medications, allergies, medical history  Review of Systems:  No other skin or systemic complaints except as noted in HPI or Assessment and Plan.  Objective  Well appearing patient in no apparent distress; mood and affect are within normal limits.   A focused examination was performed of the following areas: scattered   Relevant exam findings are noted in the Assessment and Plan.   Bx results reviewed in detail FINAL DIAGNOSIS and MICROSCOPIC DESCRIPTION  Diagnosis Skin , left upper back SPONGIOTIC DERMATITIS, SEE DESCRIPTION  Microscopic Description There is a superficial perivascular inflammatory infiltrate composed of lymphocytes, histiocytes and scattered eosinophils. Lymphocytes extend to the epidermis where there is spongiosis and overlying parakeratosis. Following review of the hematoxylin and eosin sections, a PAS stain was obtained to exclude a fungal infection. The PAS stain is negative for fungal organisms. The findings are most consistent with an eczematous dermatitis such as contact or nummular dermatitis or an id reaction.   Assessment & Plan   ATOPIC DERMATITIS and Pruritus  Exam: Scaly pink papules coalescing to plaques involving Back,Face, Scalp, Elbows  80% BSA, IGA  Flared   Pt education discussed during visit: Atopic dermatitis (eczema) is a chronic, relapsing, pruritic condition that can significantly affect quality of life. It is often associated with allergic rhinitis and/or asthma and can require treatment with topical  medications, phototherapy, or in severe cases biologic injectable medication (Dupixent; Adbry) or Oral JAK inhibitors.   - Assessment: Previously diagnosed with psoriasis, but clinical presentation more consistent with eczema. Biopsy performed 2 weeks ago confirmed eczema diagnosis. Patient using Zoryve with some improvement, particularly on face, though residual inflammation noted. Current moisturizing regimen includes Aquaphor. Reports itching after using CeraVe shampoo with zinc, possibly due to oils on scalp. Seborrheic dermatitis also present.  - Plan:    Continue Zoryve as prescribed    Continue Aquaphor for facial moisturization    Start Eucerin Advanced Repair or Eucerin Eczema for body moisturization    Initiate Dupixent therapy:     - Pending insurance approval (expected in about 3 weeks)     - Initial dosing: 2 injections on first day, then 1 injection every 2 weeks     - Schedule injection training after approval     - Arrange home delivery of medication     - Inject in thighs, belly, or arm (if administered by another person)     - Allow medication to reach room temperature before injection    Use DermaSmooth Oil or fluocinonide oil for scalp itching relief:     - Can be used as spot treatment or deep treatment overnight/for a few hours    Recommend Dove Unscented [employer] or Unscented Liquid for washing    Continue CeraVe Anti-Itch Lotion    Consider Aveeno Excedrin Eczema Therapy for severe cases    Recommend weekly Aveeno oatmeal baths    Apply Vaseline as an occlusive agent over moisturizer or on damp skin    Follow up in 4 months    Schedule quick appointment in 3 weeks for Dupixent initiation (  pending approval)      Plan: Dupixent Initiation Indications:  Patient isn't a candidate for systemic therapy with methotrexate or cyclosporine. Patient has been unresponsive to aggressive topical therapy.  Failed Treatments: Topical Steroids (triamcinolone)  and Topical  Protopic  Treatment Protocol: 600 mg [place] day 0 then 300 mg [place] every other week  Specific Contraindications Cyclosporine is contraindicated because the patient will not be able to complete the necessary follow-up labs. Methotrexate is contraindicated because the patient will not be able to complete the necessary follow-up labs. Phototherapy is contraindicated because the patient lives too far from the treatment location.   Dupixent Counseling: I discussed with the patient the risks of dupilumab including but not limited to eye infection and irritation, cold sores, injection site reactions, worsening of asthma, allergic reactions and increased risk of parasitic infection. Live vaccines should be avoided while taking dupilumab. Dupilumab will also interact with certain medications such as warfarin and cyclosporine. The patient understands that monitoring is required and they must alert us  or the primary physician if symptoms of infection or other concerning signs are noted.  Dupixent Monitoring: There is no laboratory monitoring requirement with Dupixent.  Recommend gentle skin care. ATOPIC DERMATITIS, UNSPECIFIED TYPE   Related Medications Fluocinolone Acetonide Scalp (DERMA-SMOOTHE/FS SCALP) 0.01 % OIL Use on affected areas of the scalp for itch relief as needed. May use as scalp treatment 2-3hours prior to washing.  No follow-ups on file.    Documentation: I have reviewed the above documentation for accuracy and completeness, and I agree with the above.   I, Shirron Louanne Roussel, CMA, am acting as scribe for [employer], DO.   Louana Roup, DO

## 2024-01-14 NOTE — Patient Instructions (Addendum)
 Hello Sandra Mccoy,  Thank you for visiting today. Here is a summary of the key instructions:  Medications: - Continue using Zoryve as prescribed - Start Dupixent injections once approved by insurance   - First dose: 2 injections   - Then 1 injection every 2 weeks - Use DermaSmooth Oil or fluocinonide oil for scalp itching as needed  Skin Care: - Continue using Aquaphor for face moisturizing - Try Excedrin Advanced Repair or Eucera Eczema for body moisturizing - Use Dove Unscented Bar Soap or Unscented Liquid for washing - Continue using CeraVe Anti-Itch Lotion - Apply Vaseline over moisturizer or on damp skin to lock in moisture - Take Aveeno oatmeal baths once a week  Shampoo: - Continue using CeraVe shampoo with zinc - If itching occurs after use, stop using it  Follow-up: - Make an appointment for 4 months from now - Expect a call in the next 3 weeks to schedule a quick appointment once Dupixent is approved   We look forward to seeing the positive changes in your next visit. If you have any questions or concerns before then, please do not hesitate to contact our office.  Warm regards,  Dr. Langston Reusing, Dermatology         Important Information  Due to recent changes in healthcare laws, you may see results of your pathology and/or laboratory studies on MyChart before the doctors have had a chance to review them. We understand that in some cases there may be results that are confusing or concerning to you. Please understand that not all results are received at the same time and often the doctors may need to interpret multiple results in order to provide you with the best plan of care or course of treatment. Therefore, we ask that you please give [place] 2 business days to thoroughly review all your results before contacting the office for clarification. Should we see a critical lab result, you will be contacted sooner.   If You Need Anything After Your Visit  If you have  any questions or concerns for your doctor, please call our main line at [phone] If no one answers, please leave a voicemail as directed and we will return your call as soon as possible. Messages left after 4 pm will be answered the following business day.   You may also send [place] a message via MyChart. We typically respond to MyChart messages within 1-2 business days.  For prescription refills, please ask your pharmacy to contact our office. Our fax number is [phone].  If you have an urgent issue when the clinic is closed that cannot wait until the next business day, you can page your doctor at the number below.    Please note that while we do our best to be available for urgent issues outside of office hours, we are not available 24/7.   If you have an urgent issue and are unable to reach [place], you may choose to seek medical care at your doctor's office, retail clinic, urgent care center, or emergency room.  If you have a medical emergency, please immediately call 911 or go to the emergency department. In the event of inclement weather, please call our main line at [phone] for an update on the status of any delays or closures.  Dermatology Medication Tips: Please keep the boxes that topical medications come in in order to help keep track of the instructions about where and how to use these. Pharmacies typically print the medication instructions only on the  boxes and not directly on the medication tubes.   If your medication is too expensive, please contact our office at [phone] or send us  a message through MyChart.   We are unable to tell what your co-pay for medications will be in advance as this is different depending on your insurance coverage. However, we may be able to find a substitute medication at lower cost or fill out paperwork to get insurance to cover a needed medication.   If a prior authorization is required to get your medication covered by your insurance  company, please allow us  1-2 business days to complete this process.  Drug prices often vary depending on where the prescription is filled and some pharmacies may offer cheaper prices.  The website www.goodrx.com contains coupons for medications through different pharmacies. The prices here do not account for what the cost may be with help from insurance (it may be cheaper with your insurance), but the website can give you the price if you did not use any insurance.  - You can print the associated coupon and take it with your prescription to the pharmacy.  - You may also stop by our office during regular business hours and pick up a GoodRx coupon card.  - If you need your prescription sent electronically to a different pharmacy, notify our office through [facility] or by phone at [phone]

## 2024-02-05 ENCOUNTER — Ambulatory Visit (INDEPENDENT_AMBULATORY_CARE_PROVIDER_SITE_OTHER): Payer: Medicare Other

## 2024-02-05 DIAGNOSIS — R55 Syncope and collapse: Secondary | ICD-10-CM | POA: Diagnosis not present

## 2024-02-06 ENCOUNTER — Other Ambulatory Visit (HOSPITAL_COMMUNITY): Payer: Self-pay | Admitting: Nurse Practitioner

## 2024-02-06 DIAGNOSIS — E213 Hyperparathyroidism, unspecified: Secondary | ICD-10-CM

## 2024-02-06 LAB — CUP PACEART REMOTE DEVICE CHECK
Date Time Interrogation Session: 20250506141943
Implantable Pulse Generator Implant Date: 20241112

## 2024-02-13 NOTE — Progress Notes (Signed)
 Carelink Summary Report / Loop Recorder

## 2024-02-13 NOTE — Addendum Note (Signed)
 Addended by: Lott Rouleau A on: 02/13/2024 03:12 PM   Modules accepted: Orders

## 2024-02-14 ENCOUNTER — Other Ambulatory Visit: Payer: Self-pay | Admitting: Nurse Practitioner

## 2024-02-14 DIAGNOSIS — R634 Abnormal weight loss: Secondary | ICD-10-CM

## 2024-02-15 ENCOUNTER — Ambulatory Visit: Payer: Self-pay | Admitting: Cardiovascular Disease

## 2024-02-17 ENCOUNTER — Other Ambulatory Visit: Payer: Self-pay | Admitting: Cardiology

## 2024-02-17 DIAGNOSIS — E782 Mixed hyperlipidemia: Secondary | ICD-10-CM

## 2024-02-18 ENCOUNTER — Ambulatory Visit (HOSPITAL_COMMUNITY)
Admission: RE | Admit: 2024-02-18 | Discharge: 2024-02-18 | Disposition: A | Discharge status: Home / Self Care | Source: Ambulatory Visit | Attending: Nurse Practitioner | Admitting: Nurse Practitioner

## 2024-02-18 ENCOUNTER — Encounter (HOSPITAL_COMMUNITY)
Admission: RE | Admit: 2024-02-18 | Discharge: 2024-02-18 | Disposition: A | Discharge status: Home / Self Care | Source: Ambulatory Visit | Attending: Nurse Practitioner | Admitting: Nurse Practitioner

## 2024-02-18 DIAGNOSIS — E213 Hyperparathyroidism, unspecified: Secondary | ICD-10-CM

## 2024-02-18 MED ORDER — TECHNETIUM TC 99M SESTAMIBI - CARDIOLITE
25.0000 | Freq: Once | INTRAVENOUS | Status: AC | PRN
  Administered 2024-02-18: 25 via INTRAVENOUS

## 2024-03-10 LAB — CUP PACEART REMOTE DEVICE CHECK
Date Time Interrogation Session: 20250606142241
Implantable Pulse Generator Implant Date: 20241112

## 2024-03-11 ENCOUNTER — Ambulatory Visit
Admission: RE | Admit: 2024-03-11 | Discharge: 2024-03-11 | Disposition: A | Discharge status: Home / Self Care | Source: Ambulatory Visit | Attending: Nurse Practitioner | Admitting: Nurse Practitioner

## 2024-03-11 ENCOUNTER — Ambulatory Visit (INDEPENDENT_AMBULATORY_CARE_PROVIDER_SITE_OTHER): Payer: Medicare Other

## 2024-03-11 ENCOUNTER — Ambulatory Visit: Payer: Self-pay | Admitting: Cardiovascular Disease

## 2024-03-11 DIAGNOSIS — R634 Abnormal weight loss: Secondary | ICD-10-CM

## 2024-03-11 DIAGNOSIS — R55 Syncope and collapse: Secondary | ICD-10-CM | POA: Diagnosis not present

## 2024-03-11 MED ORDER — IOPAMIDOL (ISOVUE-300) INJECTION 61%
100.0000 mL | Freq: Once | INTRAVENOUS | Status: AC | PRN
  Administered 2024-03-11: 100 mL via INTRAVENOUS

## 2024-03-12 ENCOUNTER — Encounter: Payer: Self-pay | Admitting: Dermatology

## 2024-03-12 ENCOUNTER — Ambulatory Visit (INDEPENDENT_AMBULATORY_CARE_PROVIDER_SITE_OTHER): Admitting: Dermatology

## 2024-03-12 DIAGNOSIS — L209 Atopic dermatitis, unspecified: Secondary | ICD-10-CM

## 2024-03-12 DIAGNOSIS — L219 Seborrheic dermatitis, unspecified: Secondary | ICD-10-CM

## 2024-03-12 DIAGNOSIS — L308 Other specified dermatitis: Secondary | ICD-10-CM

## 2024-03-12 MED ORDER — DUPILUMAB 300 MG/2ML ~~LOC~~ SOAJ
600.0000 mg | Freq: Once | SUBCUTANEOUS | Status: AC
Start: 2024-03-12 — End: 2024-03-12
  Administered 2024-03-12: 600 mg via SUBCUTANEOUS

## 2024-03-12 MED ORDER — ZORYVE 0.3 % EX CREA: 1.0000 | TOPICAL_CREAM | Freq: Two times a day (BID) | CUTANEOUS | 5 refills | Status: DC

## 2024-03-12 MED ORDER — ZORYVE 0.3 % EX FOAM: 1.0000 | Freq: Every day | CUTANEOUS | 11 refills | Status: DC

## 2024-03-12 NOTE — Progress Notes (Signed)
   Follow-Up Visit   Subjective  Sandra Mccoy is a 73 y.o. female accompanied by her daughter who presents for the following: Dupixent Injection Teaching  Patient present today for follow up visit for Dupixent Injection Teaching. Patient was last evaluated on 01/14/24. At this visit patient was prescribed Dupixent. Patient is present in office today for injection teaching. Patient denies medication changes. She states she is currently flared on her face and her back.  The following portions of the chart were reviewed this encounter and updated as appropriate: medications, allergies, medical history  Review of Systems:  No other skin or systemic complaints except as noted in HPI or Assessment and Plan.  Objective  Well appearing patient in no apparent distress; mood and affect are within normal limits.  A full examination was performed including scalp, head, eyes, ears, nose, lips, neck, chest, axillae, abdomen, back, buttocks, bilateral upper extremities, bilateral lower extremities, hands, feet, fingers, toes, fingernails, and toenails. All findings within normal limits unless otherwise noted below.   Relevant exam findings are noted in the Assessment and Plan.    Assessment & Plan   ATOPIC DERMATITIS Exam: Scaly pink papules coalescing to plaques 5% BSA   Flared   Atopic dermatitis (eczema) is a chronic, relapsing, pruritic condition that can significantly affect quality of life. It is often associated with allergic rhinitis and/or asthma and can require treatment with topical medications, phototherapy, or in severe cases biologic injectable medication (Dupixent; Adbry) or Oral JAK inhibitors.   Treatment Plan: - Patient instructed how to complete Dupixent Injections with Demonstration Pen - Patient completed Injections while in office at [facility], Mild erythema noted at injection site.   - Advised to continue topicals for break through Eczema Flares - Patient tolerated  well and demonstrated understanding - Recommend gentle skin care. - Plan to follow up in 4 months   DUPIXENT NDC: 4627-0350-09 LOT: [id number] EXP: 08/01/2025     Return in about 4 months (around 07/12/2024) for Eczema F/U.  I, Jetta Ager, am acting as Neurosurgeon for [employer], DO.  Documentation: I have reviewed the above documentation for accuracy and completeness, and I agree with the above.  Louana Roup, DO

## 2024-03-13 ENCOUNTER — Emergency Department (HOSPITAL_COMMUNITY)
Admission: EM | Admit: 2024-03-13 | Discharge: 2024-03-14 | Disposition: A | Discharge status: Home / Self Care | Attending: Emergency Medicine | Admitting: Emergency Medicine

## 2024-03-13 ENCOUNTER — Encounter (HOSPITAL_COMMUNITY): Payer: Self-pay | Admitting: Emergency Medicine

## 2024-03-13 DIAGNOSIS — R5383 Other fatigue: Secondary | ICD-10-CM

## 2024-03-13 DIAGNOSIS — Z79899 Other long term (current) drug therapy: Secondary | ICD-10-CM | POA: Diagnosis not present

## 2024-03-13 NOTE — ED Triage Notes (Signed)
 Pt reports bouts of extreme fatigue for 3 mos. She is not SOB, more like just feels exhausted. Denies dark stools, reports seen PCP and they have run many tests and have not found anything. She denies depression. She is alert and oriented with no signs of distress. She has implanted monitor due to syncope episodes. Calcium  high and hyperthyroidism. She has had MRI and CT scans of thyroid. MRI showed tumor but US  did not show tumor.

## 2024-03-14 ENCOUNTER — Emergency Department (HOSPITAL_COMMUNITY)

## 2024-03-14 LAB — CBC WITH DIFFERENTIAL/PLATELET
Abs Immature Granulocytes: 0.03 10*3/uL (ref 0.00–0.07)
Basophils Absolute: 0.1 10*3/uL (ref 0.0–0.1)
Basophils Relative: 1 %
Eosinophils Absolute: 0.6 10*3/uL — ABNORMAL HIGH (ref 0.0–0.5)
Eosinophils Relative: 6 %
HCT: 36.6 % (ref 36.0–46.0)
Hemoglobin: 12.3 g/dL (ref 12.0–15.0)
Immature Granulocytes: 0 %
Lymphocytes Relative: 36 %
Lymphs Abs: 3.5 10*3/uL (ref 0.7–4.0)
MCH: 31.2 pg (ref 26.0–34.0)
MCHC: 33.6 g/dL (ref 30.0–36.0)
MCV: 92.9 fL (ref 80.0–100.0)
Monocytes Absolute: 0.7 10*3/uL (ref 0.1–1.0)
Monocytes Relative: 7 %
Neutro Abs: 4.9 10*3/uL (ref 1.7–7.7)
Neutrophils Relative %: 50 %
Platelets: 188 10*3/uL (ref 150–400)
RBC: 3.94 MIL/uL (ref 3.87–5.11)
RDW: 13.1 % (ref 11.5–15.5)
WBC: 9.7 10*3/uL (ref 4.0–10.5)
nRBC: 0 % (ref 0.0–0.2)

## 2024-03-14 LAB — COMPREHENSIVE METABOLIC PANEL WITH GFR
ALT: 17 U/L (ref 0–44)
AST: 23 U/L (ref 15–41)
Albumin: 3.7 g/dL (ref 3.5–5.0)
Alkaline Phosphatase: 73 U/L (ref 38–126)
Anion gap: 11 (ref 5–15)
BUN: 17 mg/dL (ref 8–23)
CO2: 24 mmol/L (ref 22–32)
Calcium: 11 mg/dL — ABNORMAL HIGH (ref 8.9–10.3)
Chloride: 105 mmol/L (ref 98–111)
Creatinine, Ser: 0.79 mg/dL (ref 0.44–1.00)
GFR, Estimated: >60 mL/min (ref >60)
Glucose, Bld: 131 mg/dL — ABNORMAL HIGH (ref 70–99)
Potassium: 3.9 mmol/L (ref 3.5–5.1)
Sodium: 140 mmol/L (ref 135–145)
Total Bilirubin: 0.4 mg/dL (ref 0.0–1.2)
Total Protein: 7.7 g/dL (ref 6.5–8.1)

## 2024-03-14 LAB — TSH: TSH: 0.326 u[IU]/mL — ABNORMAL LOW (ref 0.350–4.500)

## 2024-03-14 MED ORDER — LACTATED RINGERS IV BOLUS
1000.0000 mL | Freq: Once | INTRAVENOUS | Status: AC
  Administered 2024-03-14: 1000 mL via INTRAVENOUS

## 2024-03-14 NOTE — ED Provider Notes (Signed)
 [place] EMERGENCY DEPARTMENT AT [facility] Provider Note   CSN: [id number] Arrival date & time: 03/13/24  2319     Patient presents with: Fatigue   Sandra Mccoy is a 73 y.o. female.   The history is provided by the patient.  Sandra Mccoy is a 73 y.o. female who presents to the Emergency Department complaining of fatigue.  She presents to the emergency department accompanied by her daughters for evaluation of 3 months of fatigue.  She has had extensive outpatient workup and presents today due to ongoing worsening fatigue, poor appetite.  No fever, headache, chest pain, Donnell pain.  She does lay in the bed a lot at the time.  She did have nausea and diarrhea a few months ago but this is now gone.  She does get nauseated with water at times but she is able to drink decaf coffee, juices.  She lives with her daughter and her husband.  She does have a history of hyperparathyroidism.  She did recently have outpatient workup with MRI, CT and ultrasound.     Prior to Admission medications   Medication Sig Start Date End Date Taking? Authorizing Provider  Bepotastine  Besilate 1.5 % SOLN Place 1 drop into both eyes 2 (two) times daily as needed (itchy/watery eyes). 04/18/23   Brian Campanile, MD  diltiazem  (CARDIZEM  LA) 120 MG 24 hr tablet Take 120 mg by mouth daily. 04/08/23   [provider]  ezetimibe  (ZETIA ) 10 MG tablet Take 10 mg by mouth every morning.    [provider]  Fluocinolone  Acetonide Scalp (DERMA-SMOOTHE /FS SCALP) 0.01 % OIL Use on affected areas of the scalp for itch relief as needed. May use as scalp treatment 2-3hours prior to washing. 01/14/24   Dellar Fenton, DO  fluticasone  (FLONASE ) 50 MCG/ACT nasal spray Place 2 sprays into both nostrils 2 (two) times daily as needed for allergies or rhinitis. 12/13/22   Brian Campanile, MD  furosemide  (LASIX ) 20 MG tablet Take 1 tablet (20 mg total) by mouth daily. 08/17/21   Gwyndolyn Lerner, MD  GARLIC PO Take 1 tablet by mouth at bedtime.    [provider]  Ginger, Zingiber officinalis, (GINGER PO) Take 1 tablet by mouth at bedtime.    [provider]  hydrOXYzine  (ATARAX ) 10 MG tablet Take 1 tablet (10 mg total) by mouth 3 (three) times daily as needed for itching. 10/10/22   Lonita Roach, MD  levocetirizine (XYZAL ) 5 MG tablet Take 1 tablet (5 mg total) by mouth daily. 11/19/23   Brian Campanile, MD  levothyroxine  (SYNTHROID ) 100 MCG tablet Take 100 mcg by mouth every morning. 03/09/23   [provider]  Multiple Vitamin (MULTIVITAMIN WITH MINERALS) TABS tablet Take 1 tablet by mouth every morning.    [provider]  olmesartan  (BENICAR ) 40 MG tablet Take 1 tablet (40 mg total) by mouth daily. Patient taking differently: Take 20 mg by mouth daily. 07/15/21   Gwyndolyn Lerner, MD  Roflumilast  (ZORYVE ) 0.3 % CREA Apply 1 Application topically 2 (two) times daily. 03/12/24   Dellar Fenton, DO  Roflumilast  (ZORYVE ) 0.3 % FOAM Apply 1 Application topically daily. Apply to the scalp as needed for scalp 03/12/24   Dellar Fenton, DO  rosuvastatin  (CRESTOR ) 20 MG tablet TAKE 1 TABLET BY MOUTH EVERY DAY 02/19/24   Patwardhan, Kaye Parsons, MD    Allergies: Lipitor [atorvastatin] and Simvastatin    Review of Systems  All other systems  reviewed and are negative.   Updated Vital Signs BP (!) 142/67   Pulse 75   Temp 98 F (36.7 C)   Resp 17   SpO2 100%   Physical Exam Vitals and nursing note reviewed.  Constitutional:      Appearance: She is well-developed.  HENT:     Head: Normocephalic and atraumatic.   Cardiovascular:     Rate and Rhythm: Normal rate and regular rhythm.     Heart sounds: No murmur heard. Pulmonary:     Effort: Pulmonary effort is normal. No respiratory distress.     Breath sounds: Normal breath sounds.  Abdominal:     Palpations: Abdomen is soft.     Tenderness: There is no abdominal tenderness.  There is no guarding or rebound.   Musculoskeletal:        General: No tenderness.   Skin:    General: Skin is warm and dry.   Neurological:     Mental Status: She is alert and oriented to person, place, and time.     Comments: No asymmetry of facial movements, 5/5 strength in all four extremities with sensation to light touch intact in all four extremities.    Psychiatric:        Behavior: Behavior normal.     (all labs ordered are listed, but only abnormal results are displayed) Labs Reviewed  COMPREHENSIVE METABOLIC PANEL WITH GFR - Abnormal; Notable for the following components:      Result Value   Glucose, Bld 131 (*)    Calcium  11.0 (*)    All other components within normal limits  CBC WITH DIFFERENTIAL/PLATELET - Abnormal; Notable for the following components:   Eosinophils Absolute 0.6 (*)    All other components within normal limits  TSH - Abnormal; Notable for the following components:   TSH 0.326 (*)    All other components within normal limits    EKG: EKG Interpretation Date/Time:  Friday March 14 2024 00:05:22 EDT Ventricular Rate:  65 PR Interval:  139 QRS Duration:  74 QT Interval:  363 QTC Calculation: 378 R Axis:   36  Text Interpretation: Sinus rhythm Low voltage, precordial leads Probable anteroseptal infarct, old Confirmed by Kelsey Patricia [phone]) on 03/14/2024 2:14:57 AM  Radiology: Lenell Query Chest 2 View Result Date: 03/14/2024 CLINICAL DATA:  Fatigue for several months EXAM: CHEST - 2 VIEW COMPARISON:  01/17/2019 FINDINGS: Cardiac shadow is stable. Aortic calcifications are noted. The lungs are well aerated bilaterally. No focal infiltrate or effusion is seen. No bony abnormality is noted. Loop recorder is seen. IMPRESSION: No active cardiopulmonary disease. Electronically Signed   By: Violeta Grey M.D.   On: 03/14/2024 00:31     Procedures   Medications Ordered in the ED  lactated ringers  bolus 1,000 mL (0 mLs Intravenous Stopped 03/14/24 0347)                                     Medical Decision Making Amount and/or Complexity of Data Reviewed Labs: ordered. Radiology: ordered.   Patient with history of hyperparathyroidism here for evaluation of progressive fatigue, generalized weakness over the last several months.  She is nontoxic-appearing on evaluation with no focal neurologic deficits.  Labs are significant for hypercalcemia, she has been higher in the past.  She was treated with IV fluids.  No current evidence of serious bacterial infection, presentation is not consistent with CVA, myxedema coma.  Feel she is stable for discharge home with outpatient follow-up and return precautions.     Final diagnoses:  Other fatigue  Hypercalcemia    ED Discharge Orders     None          Kelsey Patricia, MD 03/14/24 [phone]

## 2024-03-19 NOTE — Addendum Note (Signed)
 Addended by: Lott Rouleau A on: 03/19/2024 10:27 AM   Modules accepted: Orders

## 2024-03-19 NOTE — Progress Notes (Signed)
 Carelink Summary Report / Loop Recorder

## 2024-04-11 ENCOUNTER — Ambulatory Visit

## 2024-04-11 DIAGNOSIS — R55 Syncope and collapse: Secondary | ICD-10-CM

## 2024-04-11 LAB — CUP PACEART REMOTE DEVICE CHECK
Date Time Interrogation Session: 20250710232833
Implantable Pulse Generator Implant Date: 20241112

## 2024-04-16 ENCOUNTER — Ambulatory Visit: Payer: Self-pay | Admitting: Cardiovascular Disease

## 2024-04-17 ENCOUNTER — Encounter: Payer: Self-pay | Admitting: Allergy

## 2024-04-17 ENCOUNTER — Ambulatory Visit (INDEPENDENT_AMBULATORY_CARE_PROVIDER_SITE_OTHER): Payer: Medicare Other | Admitting: Allergy

## 2024-04-17 ENCOUNTER — Other Ambulatory Visit: Payer: Self-pay

## 2024-04-17 VITALS — BP 130/78 | HR 78 | Temp 98.0°F | Resp 18 | Ht 66.54 in | Wt 146.7 lb

## 2024-04-17 DIAGNOSIS — H1013 Acute atopic conjunctivitis, bilateral: Secondary | ICD-10-CM

## 2024-04-17 DIAGNOSIS — J3089 Other allergic rhinitis: Secondary | ICD-10-CM | POA: Diagnosis not present

## 2024-04-17 DIAGNOSIS — J302 Other seasonal allergic rhinitis: Secondary | ICD-10-CM | POA: Diagnosis not present

## 2024-04-17 MED ORDER — FLUTICASONE PROPIONATE 50 MCG/ACT NA SUSP: 2.0000 | Freq: Two times a day (BID) | NASAL | 5 refills | Status: AC | PRN

## 2024-04-17 MED ORDER — LEVOCETIRIZINE DIHYDROCHLORIDE 5 MG PO TABS: 5.0000 mg | ORAL_TABLET | Freq: Every day | ORAL | 5 refills | Status: DC

## 2024-04-17 MED ORDER — AZELASTINE HCL 0.1 % NA SOLN: 2.0000 | Freq: Two times a day (BID) | NASAL | 12 refills | Status: DC

## 2024-04-17 NOTE — Progress Notes (Signed)
 Follow-up Note  RE: Sandra Mccoy MRN: [id number] DOB: 06/21/1951 Date of Office Visit: 04/17/2024   History of present illness: Sandra Mccoy is a 73 y.o. female presenting today for follow-up of allergic rhinitis with conjunctivitis and epistaxis.  She was last seen in the office on 10/19/2023 by myself.  She presents today with her daughter. Discussed the use of AI scribe software for clinical note transcription with the patient, who gave verbal consent to proceed.  Her allergy  symptoms have been less severe as she has stayed indoors more often. She experiences a runny nose and occasional sneezing, using multiple nasal sprays, including Astapro (azelastine ) and Flonase , three to four times a week. She also takes Xyzal  (levocetirizine) every night and uses eye drops twice daily as a preventative measure. Her eyes have been red and itchy at times. No worsening allergy  symptoms this year compared to previous years.  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 A subsequent ultrasound in June did not reveal the tumor. She is scheduled for a bone density test next week to further evaluate her condition. She is awaiting further evaluation before starting any medication for her calcium  levels.  She was hospitalized approximately one month ago, in June, due to dehydration and fatigue, where she received IV fluids and was subsequently released. Her family felt it was best for her to be checked out due to her weakness at the time. Since then, she has been doing better.  Daughter reports she has been diagnosed with stage 3A kidney disease but has not yet seen a nephrologist. She is also due for a colonoscopy.      Review of systems: 10pt ROS negative unless noted above in HPI  Past medical/social/surgical/family history have been reviewed and are unchanged unless specifically indicated below.  No changes  Medication  List: Current Outpatient Medications  Medication Sig Dispense Refill   azelastine  (ASTELIN ) 0.1 % nasal spray Place 2 sprays into both nostrils 2 (two) times daily. Use in each nostril as directed 30 mL 12   Bepotastine  Besilate 1.5 % SOLN Place 1 drop into both eyes 2 (two) times daily as needed (itchy/watery eyes). 10 mL 5   diltiazem  (CARDIZEM  LA) 120 MG 24 hr tablet Take 120 mg by mouth daily.     DUPIXENT  300 MG/2ML SOAJ inject 300 mg Subcutaneous Every 2 weeks     ezetimibe  (ZETIA ) 10 MG tablet Take 10 mg by mouth every morning.     Fluocinolone  Acetonide Scalp (DERMA-SMOOTHE /FS SCALP) 0.01 % OIL Use on affected areas of the scalp for itch relief as needed. May use as scalp treatment 2-3hours prior to washing. 20 mL 2   furosemide  (LASIX ) 20 MG tablet Take 1 tablet (20 mg total) by mouth daily. 90 tablet 3   GARLIC PO Take 1 tablet by mouth at bedtime.     Ginger, Zingiber officinalis, (GINGER PO) Take 1 tablet by mouth at bedtime.     hydrOXYzine  (ATARAX ) 10 MG tablet Take 1 tablet (10 mg total) by mouth 3 (three) times daily as needed for itching. 30 tablet 0   levothyroxine  (SYNTHROID ) 100 MCG tablet Take 100 mcg by mouth every morning.     Multiple Vitamin (MULTIVITAMIN WITH MINERALS) TABS tablet Take 1 tablet by mouth every morning.     olmesartan  (BENICAR ) 40 MG tablet Take 1 tablet (40 mg total) by mouth daily. 90 tablet 3  Roflumilast  (ZORYVE ) 0.3 % CREA Apply 1 Application topically 2 (two) times daily. 60 g 5   Roflumilast  (ZORYVE ) 0.3 % FOAM Apply 1 Application topically daily. Apply to the scalp as needed for scalp 60 g 11   rosuvastatin  (CRESTOR ) 20 MG tablet TAKE 1 TABLET BY MOUTH EVERY DAY 90 tablet 1   fluticasone  (FLONASE ) 50 MCG/ACT nasal spray Place 2 sprays into both nostrils 2 (two) times daily as needed for allergies or rhinitis. 16 g 5   levocetirizine (XYZAL ) 5 MG tablet Take 1 tablet (5 mg total) by mouth daily. 30 tablet 5   No current facility-administered  medications for this visit.     Known medication allergies: Allergies  Allergen Reactions   Lipitor [Atorvastatin] Nausea And Vomiting and Other (See Comments)    Made her loopy   Simvastatin Other (See Comments)    Increased LFT's     Physical examination: Blood pressure 130/78, pulse 78, temperature 98 F (36.7 C), temperature source Temporal, resp. rate 18, height 5' 6.54 (1.69 m), weight 146 lb 11.2 oz (66.5 kg), SpO2 98%.  General: Alert, interactive, in no acute distress. HEENT: PERRLA, TMs pearly gray, turbinates non-edematous without discharge, post-pharynx non erythematous. Neck: Supple without lymphadenopathy. Lungs: Clear to auscultation without wheezing, rhonchi or rales. {no increased work of breathing. CV: Normal S1, S2 without murmurs. Abdomen: Nondistended, nontender. Skin: Warm and dry, without lesions or rashes. Extremities:  No clubbing, cyanosis or edema. Neuro:   Grossly intact.  Diagnostics/Labs: None today  Assessment and plan: Allergic rhinitis with conjunctivitis History of epistaxis - continue avoidance measures for grasses, weeds, trees, indoor molds, outdoor molds, dust mites, and cat. - Continue taking:  Xyzal  (levocetirizine) 5mg  tablet once daily.   Astepro  (azelastine ) 2 sprays per nostril 2 times daily for drainage.  Flonase  (fluticasone ) 1-2 sprays per nostril 3 days (M, W, F) for congestion control.   With using nasal sprays point tip of bottle toward eye on same side nostril and lean head slightly forward for best technique.   Bepreve one drop per eye twice a day as needed for itchy/watery eyes.  - You can use an extra dose of the antihistamine, if needed, for breakthrough symptoms.  - Recommend use of nasal saline spray periodically in the week to help moisturize the nose.  Consider nasal saline rinses 1-2 times daily to remove allergens from the nasal cavities as well as help with mucous clearance (this is especially helpful to do  before the nasal sprays are given) - Allergy  shots re-train and reset the immune system to ignore environmental allergens and decrease the resulting immune response to those allergens (sneezing, itchy watery eyes, runny nose, nasal congestion, etc).    - Allergy  shots improve symptoms in 75-85% of patients.  - We can discuss more at a future appointment if the medications are not working for you.  Follow-up in 6 months or sooner if needed  I appreciate the opportunity to take part in Sandra Mccoy's care. Please do not hesitate to contact me with questions.  Sincerely,   Danita Brain, MD Allergy /Immunology [facility]

## 2024-04-17 NOTE — Patient Instructions (Signed)
-   continue avoidance measures for grasses, weeds, trees, indoor molds, outdoor molds, dust mites, and cat. - Continue taking:  Xyzal (levocetirizine) 5mg  tablet once daily.   Astepro (azelastine) 2 sprays per nostril 2 times daily for drainage.  Flonase (fluticasone) 1-2 sprays per nostril 3 days (M, W, F) for congestion control.   With using nasal sprays point tip of bottle toward eye on same side nostril and lean head slightly forward for best technique.   Bepreve one drop per eye twice a day as needed for itchy/watery eyes.  - You can use an extra dose of the antihistamine, if needed, for breakthrough symptoms.  - Recommend use of nasal saline spray periodically in the week to help moisturize the nose.  Consider nasal saline rinses 1-2 times daily to remove allergens from the nasal cavities as well as help with mucous clearance (this is especially helpful to do before the nasal sprays are given) - Allergy shots "re-train" and "reset" the immune system to ignore environmental allergens and decrease the resulting immune response to those allergens (sneezing, itchy watery eyes, runny nose, nasal congestion, etc).    - Allergy shots improve symptoms in 75-85% of patients.  - We can discuss more at a future appointment if the medications are not working for you.   Follow-up in 6 months or sooner if needed

## 2024-04-24 ENCOUNTER — Telehealth: Payer: Self-pay | Admitting: *Deleted

## 2024-04-24 NOTE — Telephone Encounter (Signed)
 PRIMARY CARD IS DR. PATWARDHAN.

## 2024-04-24 NOTE — Telephone Encounter (Signed)
   Pre-operative Risk Assessment    Patient Name: Sandra Mccoy  DOB: Nov 08, 1950 MRN: [id number]   Date of last office visit: 08/23/23 DR. PATWARDHAN Date of next office visit: NONE   Request for Surgical Clearance    Procedure:  COLONOSCOPY/ENDOSCOPY (UNINTENTIONAL WEIGHT LOSS, FECAL INCONTINENCE, NAUSEA)  Date of Surgery:  Clearance 05/27/24                                Surgeon:  DR. DIANNA Surgeon's Group or Practice Name:  [facility] Phone number:  [phone] Fax number: [phone]   Type of Clearance Requested:   - Medical ; NONE INDICATED ON FORM TO BE HELD FORM DOES ASK IF OK TO HAVE AT ENDOSCOPY CENTER?    Type of Anesthesia:  PROPOFOL     Additional requests/questions:    Bonney Niels Jest   04/24/2024, 2:47 PM

## 2024-04-24 NOTE — Telephone Encounter (Signed)
   Name: Sandra Mccoy  DOB: 07-13-1951  MRN: [id number]  Primary Cardiologist: None   Preoperative team, please contact this patient and set up a phone call appointment for further preoperative risk assessment. Please obtain consent and complete medication review. Thank you for your help.  I confirm that guidance regarding antiplatelet and oral anticoagulation therapy has been completed and, if necessary, noted below.  None requested  I also confirmed the patient resides in the state of [place] . As per [facility] Medical Board telemedicine laws, the patient must reside in the state in which the provider is licensed.   [name], NP 04/24/2024, 4:29 PM [facility]

## 2024-04-24 NOTE — Telephone Encounter (Signed)
 Left message to call back to schedule tele pre op appt.

## 2024-04-28 ENCOUNTER — Telehealth: Payer: Self-pay | Admitting: *Deleted

## 2024-04-28 NOTE — Telephone Encounter (Signed)
 Pt returning call

## 2024-04-28 NOTE — Telephone Encounter (Signed)
 Pt has been scheduled tele preop appt 05/16/24. Med rec and consent are done.      Patient Consent for Virtual Visit        Sandra Mccoy has provided verbal consent on 04/28/2024 for a virtual visit (video or telephone).   CONSENT FOR VIRTUAL VISIT FOR:  Sandra Mccoy  By participating in this virtual visit I agree to the following:  I hereby voluntarily request, consent and authorize [facility] and its employed or contracted physicians, physician assistants, nurse practitioners or other licensed health care professionals (the Practitioner), to provide me with telemedicine health care services (the "Services) as deemed necessary by the treating Practitioner. I acknowledge and consent to receive the Services by the Practitioner via telemedicine. I understand that the telemedicine visit will involve communicating with the Practitioner through live audiovisual communication technology and the disclosure of certain medical information by electronic transmission. I acknowledge that I have been given the opportunity to request an in-person assessment or other available alternative prior to the telemedicine visit and am voluntarily participating in the telemedicine visit.  I understand that I have the right to withhold or withdraw my consent to the use of telemedicine in the course of my care at any time, without affecting my right to future care or treatment, and that the Practitioner or I may terminate the telemedicine visit at any time. I understand that I have the right to inspect all information obtained and/or recorded in the course of the telemedicine visit and may receive copies of available information for a reasonable fee.  I understand that some of the potential risks of receiving the Services via telemedicine include:  Delay or interruption in medical evaluation due to technological equipment failure or disruption; Information transmitted may not be sufficient (e.g. poor resolution  of images) to allow for appropriate medical decision making by the Practitioner; and/or  In rare instances, security protocols could fail, causing a breach of personal health information.  Furthermore, I acknowledge that it is my responsibility to provide information about my medical history, conditions and care that is complete and accurate to the best of my ability. I acknowledge that Practitioner's advice, recommendations, and/or decision may be based on factors not within their control, such as incomplete or inaccurate data provided by me or distortions of diagnostic images or specimens that may result from electronic transmissions. I understand that the practice of medicine is not an exact science and that Practitioner makes no warranties or guarantees regarding treatment outcomes. I acknowledge that a copy of this consent can be made available to me via my patient portal [facility] MyChart), or I can request a printed copy by calling the office of [facility].    I understand that my insurance will be billed for this visit.   I have read or had this consent read to me. I understand the contents of this consent, which adequately explains the benefits and risks of the Services being provided via telemedicine.  I have been provided ample opportunity to ask questions regarding this consent and the Services and have had my questions answered to my satisfaction. I give my informed consent for the services to be provided through the use of telemedicine in my medical care

## 2024-04-28 NOTE — Telephone Encounter (Signed)
 Pt has been scheduled tele preop appt 05/16/24. Med rec and consent are done.

## 2024-04-28 NOTE — Telephone Encounter (Signed)
2nd attempt to reach pt to schedule tele pre op appt.

## 2024-04-29 ENCOUNTER — Ambulatory Visit (HOSPITAL_BASED_OUTPATIENT_CLINIC_OR_DEPARTMENT_OTHER)
Admission: RE | Admit: 2024-04-29 | Discharge: 2024-04-29 | Disposition: A | Discharge status: Home / Self Care | Source: Ambulatory Visit | Attending: Family Medicine | Admitting: Family Medicine

## 2024-04-29 ENCOUNTER — Other Ambulatory Visit: Payer: Medicare Other

## 2024-04-29 DIAGNOSIS — M8588 Other specified disorders of bone density and structure, other site: Secondary | ICD-10-CM | POA: Diagnosis present

## 2024-05-05 ENCOUNTER — Other Ambulatory Visit: Payer: Self-pay | Admitting: Allergy

## 2024-05-12 ENCOUNTER — Ambulatory Visit (INDEPENDENT_AMBULATORY_CARE_PROVIDER_SITE_OTHER)

## 2024-05-12 DIAGNOSIS — R55 Syncope and collapse: Secondary | ICD-10-CM

## 2024-05-12 LAB — CUP PACEART REMOTE DEVICE CHECK
Date Time Interrogation Session: 20250810234327
Implantable Pulse Generator Implant Date: 20241112

## 2024-05-13 ENCOUNTER — Ambulatory Visit: Payer: Self-pay | Admitting: Cardiovascular Disease

## 2024-05-13 NOTE — Progress Notes (Signed)
 Carelink Summary Report / Loop Recorder

## 2024-05-16 ENCOUNTER — Ambulatory Visit: Attending: Cardiovascular Disease | Admitting: Nurse Practitioner

## 2024-05-16 DIAGNOSIS — Z01810 Encounter for preprocedural cardiovascular examination: Secondary | ICD-10-CM

## 2024-05-16 NOTE — Progress Notes (Signed)
 Virtual Visit via Telephone Note   Because of ETHELYN CERNIGLIA co-morbid illnesses, she is at least at moderate risk for complications without adequate follow up.  This format is felt to be most appropriate for this patient at this time.  Due to technical limitations with video connection (technology), today's appointment will be conducted as an audio only telehealth visit, and ORIANNA BISKUP verbally agreed to proceed in this manner.   All issues noted in this document were discussed and addressed.  No physical exam could be performed with this format.  Evaluation Performed:  Preoperative cardiovascular risk assessment _____________   Date:  05/16/2024   Patient ID:  CAYCI MCNABB, DOB 1951/02/10, MRN [id number] Patient Location:  Home Provider location:   Office  Primary Care Provider:  Seabron Lenis, MD Primary Cardiologist:  Newman JINNY Lawrence, MD  Chief Complaint / Patient Profile   73 y.o. y/o female with a h/o syncope s/p ILR, NSVT, hypertension, hyperlipidemia, and type 2 diabetes who is pending colonoscopy/endoscopy on 05/27/2024 with Dr. Dianna of [facility] and presents today for telephonic preoperative cardiovascular risk assessment.  History of Present Illness    CHESNIE CAPELL is a 73 y.o. female who presents via audio/video conferencing for a telehealth visit today.  Pt was last seen in cardiology clinic on 08/23/2023 by Dr. Lawrence.  At that time AVEENA BARI was doing well.  The patient is now pending procedure as outlined above. Since her last visit, she has done well from a cardiac standpoint.   She denies chest pain, palpitations, dyspnea, pnd, orthopnea, n, v, dizziness, syncope, edema, weight gain, or early satiety. All other systems reviewed and are otherwise negative except as noted above.   Past Medical History    Past Medical History:  Diagnosis Date   Acute cholecystitis 01/01/2021   Diabetes mellitus without complication (HCC)    diet controlled   Eczema     Hyperlipidemia    Hypertension    Past Surgical History:  Procedure Laterality Date   CHOLECYSTECTOMY N/A 01/03/2021   Procedure: LAPAROSCOPIC CHOLECYSTECTOMY WITH INTRAOPERATIVE CHOLANGIOGRAM AND UMBLICIAL HERNIA REPAIR;  Surgeon: Eletha Boas, MD;  Location: [facility];  Service: General;  Laterality: N/A;    Allergies  Allergies  Allergen Reactions   Lipitor [Atorvastatin] Nausea And Vomiting and Other (See Comments)    Made her loopy   Simvastatin Other (See Comments)    Increased LFT's    Home Medications    Prior to Admission medications   Medication Sig Start Date End Date Taking? Authorizing Provider  azelastine  (ASTELIN ) 0.1 % nasal spray Place 2 sprays into both nostrils 2 (two) times daily. Use in each nostril as directed 04/17/24   Jeneal Danita Macintosh, MD  Bepotastine  Besilate 1.5 % SOLN PLACE 1 DROP INTO BOTH EYES 2 (TWO) TIMES DAILY AS NEEDED (ITCHY/WATERY EYES). 05/06/24   Jeneal Danita Macintosh, MD  diltiazem  (CARDIZEM  LA) 120 MG 24 hr tablet Take 120 mg by mouth daily. 04/08/23   [provider]  DUPIXENT  300 MG/2ML SOAJ inject 300 mg Subcutaneous Every 2 weeks    [provider]  ezetimibe  (ZETIA ) 10 MG tablet Take 10 mg by mouth every morning.    [provider]  Fluocinolone  Acetonide Scalp (DERMA-SMOOTHE /FS SCALP) 0.01 % OIL Use on affected areas of the scalp for itch relief as needed. May use as scalp treatment 2-3hours prior to washing. 01/14/24   Lenis Delon SAILOR, DO  fluticasone  (FLONASE ) 50 MCG/ACT nasal spray Place  2 sprays into both nostrils 2 (two) times daily as needed for allergies or rhinitis. 04/17/24   Jeneal Danita Macintosh, MD  furosemide (LASIX) 20 MG tablet Take 1 tablet (20 mg total) by mouth daily. 08/17/21   Kassie Mallick, MD  GARLIC PO Take 1 tablet by mouth at bedtime.    [provider]  Ginger, Zingiber officinalis, (GINGER PO) Take 1 tablet by mouth at bedtime.    [provider]  hydrOXYzine  (ATARAX) 10 MG tablet Take 1 tablet (10 mg total) by mouth 3 (three) times daily as needed for itching. 10/10/22   Jadine Toribio SQUIBB, MD  levocetirizine (XYZAL) 5 MG tablet Take 1 tablet (5 mg total) by mouth daily. 04/17/24   Jeneal Danita Macintosh, MD  levothyroxine (SYNTHROID) 100 MCG tablet Take 100 mcg by mouth every morning. 03/09/23   [provider]  Multiple Vitamin (MULTIVITAMIN WITH MINERALS) TABS tablet Take 1 tablet by mouth every morning.    [provider]  olmesartan (BENICAR) 40 MG tablet Take 1 tablet (40 mg total) by mouth daily. 07/15/21   Kassie Mallick, MD  Roflumilast (ZORYVE) 0.3 % CREA Apply 1 Application topically 2 (two) times daily. 03/12/24   Alm Delon SAILOR, DO  Roflumilast (ZORYVE) 0.3 % FOAM Apply 1 Application topically daily. Apply to the scalp as needed for scalp 03/12/24   Alm Delon SAILOR, DO  rosuvastatin (CRESTOR) 20 MG tablet TAKE 1 TABLET BY MOUTH EVERY DAY 02/19/24   Patwardhan, Newman PARAS, MD    Physical Exam    Vital Signs:  KENNISHA QIN does not have vital signs available for review today.  Given telephonic nature of communication, physical exam is limited. AAOx3. NAD. Normal affect.  Speech and respirations are unlabored.  Accessory Clinical Findings    None  Assessment & Plan    1.  Preoperative Cardiovascular Risk Assessment:  According to the Revised Cardiac Risk Index (RCRI), her Perioperative Risk of Major Cardiac Event is (%): 0.4. Her Functional Capacity in METs is: 6.45 according to the Duke Activity Status Index (DASI). Therefore, based on ACC/AHA guidelines, patient would be at acceptable risk for the planned procedure without further cardiovascular testing. Okay for procedure to be done at endoscopy center.  The patient was advised that if she develops new symptoms prior to surgery to contact our office to arrange for a follow-up visit, and she verbalized understanding.  A copy of this note will be routed to  requesting surgeon.  Time:   Today, I have spent 5 minutes with the patient with telehealth technology discussing medical history, symptoms, and management plan.     Damien JAYSON Braver, NP  05/16/2024, 11:20 AM

## 2024-05-19 ENCOUNTER — Ambulatory Visit (INDEPENDENT_AMBULATORY_CARE_PROVIDER_SITE_OTHER): Admitting: Dermatology

## 2024-05-19 ENCOUNTER — Encounter: Payer: Self-pay | Admitting: Dermatology

## 2024-05-19 VITALS — BP 112/61 | HR 66

## 2024-05-19 DIAGNOSIS — E213 Hyperparathyroidism, unspecified: Secondary | ICD-10-CM | POA: Insufficient documentation

## 2024-05-19 DIAGNOSIS — J309 Allergic rhinitis, unspecified: Secondary | ICD-10-CM | POA: Insufficient documentation

## 2024-05-19 DIAGNOSIS — L299 Pruritus, unspecified: Secondary | ICD-10-CM

## 2024-05-19 DIAGNOSIS — I700 Atherosclerosis of aorta: Secondary | ICD-10-CM | POA: Insufficient documentation

## 2024-05-19 DIAGNOSIS — F439 Reaction to severe stress, unspecified: Secondary | ICD-10-CM | POA: Insufficient documentation

## 2024-05-19 DIAGNOSIS — M85851 Other specified disorders of bone density and structure, right thigh: Secondary | ICD-10-CM | POA: Insufficient documentation

## 2024-05-19 DIAGNOSIS — F4320 Adjustment disorder, unspecified: Secondary | ICD-10-CM | POA: Insufficient documentation

## 2024-05-19 DIAGNOSIS — E1136 Type 2 diabetes mellitus with diabetic cataract: Secondary | ICD-10-CM | POA: Insufficient documentation

## 2024-05-19 DIAGNOSIS — L209 Atopic dermatitis, unspecified: Secondary | ICD-10-CM

## 2024-05-19 DIAGNOSIS — M8588 Other specified disorders of bone density and structure, other site: Secondary | ICD-10-CM | POA: Insufficient documentation

## 2024-05-19 DIAGNOSIS — R9389 Abnormal findings on diagnostic imaging of other specified body structures: Secondary | ICD-10-CM | POA: Insufficient documentation

## 2024-05-19 DIAGNOSIS — L309 Dermatitis, unspecified: Secondary | ICD-10-CM | POA: Insufficient documentation

## 2024-05-19 NOTE — Patient Instructions (Addendum)
 Date: Mon May 19 2024  Hello Aariona,  Thank you for visiting today. Here is a summary of the key instructions:  - Medications:   - Continue using Dupixent  as prescribed   - Apply Zoryve  (roflumilast ) cream once a day to ears for itching   - Use Zoryve  for any itching between now and next visit  - Skin Care:   - Try new moisturizer samples:     - Use Avene Tolerance in the morning     - Apply Avene Cicelfate Balm at night   - Use gentle soap like Dove   - Avoid hot water when bathing  - Follow-up:   - Next appointment in 6 months  - Other Instructions:   - Eat a diet rich in antioxidants (green leafy veggie, pigmented fruits such as blueberries, strawberries and blackberries)   - Reduce sugar, dairy, and red meat intake   - Manage stress   - Get 15-20 minutes of sun exposure daily  Please reach out if you have any questions or concerns.  Warm regards,  Dr. [facility]on [facility]    Important Information   Due to recent changes in healthcare laws, you may see results of your pathology and/or laboratory studies on MyChart before the doctors have had a chance to review them. We understand that in some cases there may be results that are confusing or concerning to you. Please understand that not all results are received at the same time and often the doctors may need to interpret multiple results in order to provide you with the best plan of care or course of treatment. Therefore, we ask that you please give us  2 business days to thoroughly review all your results before contacting the office for clarification. Should we see a critical lab result, you will be contacted sooner.     If You Need Anything After Your Visit   If you have any questions or concerns for your doctor, please call our main line at [phone]. If no one answers, please leave a voicemail as directed and we will return your call as soon as possible. Messages left after 4 pm will be answered the following  business day.    You may also send us  a message via MyChart. We typically respond to MyChart messages within 1-2 business days.  For prescription refills, please ask your pharmacy to contact our office. Our fax number is [phone].  If you have an urgent issue when the clinic is closed that cannot wait until the next business day, you can page your doctor at the number below.     Please note that while we do our best to be available for urgent issues outside of office hours, we are not available 24/7.    If you have an urgent issue and are unable to reach us , you may choose to seek medical care at your doctor's office, retail clinic, urgent care center, or emergency room.   If you have a medical emergency, please immediately call 911 or go to the emergency department. In the event of inclement weather, please call our main line at [phone] for an update on the status of any delays or closures.  Dermatology Medication Tips: Please keep the boxes that topical medications come in in order to help keep track of the instructions about where and how to use these. Pharmacies typically print the medication instructions only on the boxes and not directly on the medication tubes.   If your medication is too  expensive, please contact our office at [phone] or send us  a message through MyChart.    We are unable to tell what your co-pay for medications will be in advance as this is different depending on your insurance coverage. However, we may be able to find a substitute medication at lower cost or fill out paperwork to get insurance to cover a needed medication.    If a prior authorization is required to get your medication covered by your insurance company, please allow us  1-2 business days to complete this process.   Drug prices often vary depending on where the prescription is filled and some pharmacies may offer cheaper prices.   The website www.goodrx.com contains coupons for  medications through different pharmacies. The prices here do not account for what the cost may be with help from insurance (it may be cheaper with your insurance), but the website can give you the price if you did not use any insurance.  - You can print the associated coupon and take it with your prescription to the pharmacy.  - You may also stop by our office during regular business hours and pick up a GoodRx coupon card.  - If you need your prescription sent electronically to a different pharmacy, notify our office through [facility] or by phone at [phone]

## 2024-05-19 NOTE — Progress Notes (Signed)
   Follow-Up Visit   Subjective  Sandra Mccoy is a 73 y.o. female who presents for the following: Atopic Dermatitis  Patient present today for follow up visit for Atopic Dermatitis. Patient was last evaluated on 03/12/24. At this visit patient had her first Dupixent  Injection. Patient reports sxs are improving. Patient rates her itch 2 out of 10. Patient denies medication changes.  The following portions of the chart were reviewed this encounter and updated as appropriate: medications, allergies, medical history  Review of Systems:  No other skin or systemic complaints except as noted in HPI or Assessment and Plan.  Objective  Well appearing patient in no apparent distress; mood and affect are within normal limits.  A focused examination was performed of the following areas: Face and body  Relevant exam findings are noted in the Assessment and Plan.    Assessment & Plan   1. Atopic Dermatitis - Assessment: Patient has been on Dupixent  for 8 weeks with notable improvement. Itch severity has decreased to a current rating of 2 out of 10. Maximum benefit of Dupixent  is expected at 6 months, with incremental improvements typically observed every 2 weeks. Residual itching persists only in the ear area. Biopsy has confirmed the diagnosis of eczema/atopic dermatitis.  - Plan:    Continue Dupixent  therapy    Apply Zoryve  cream once daily to ears for residual itching    Use Zareef as needed for breakthrough itching    Implement gentle skincare routine with gentle soap and avoid hot water    Trial Avene Tolerance moisturizer in morning and Avene Cicelfate Balm at nighttime    Increase antioxidant-rich foods and reduce intake of sugar, dairy, and red meat    Implement stress management techniques and aim for 15-20 minutes of daily sun exposure  Follow up in 6 months.     Return in about 6 months (around 11/19/2024) for Eczema F/U.  I, Jetta Ager, am acting as Neurosurgeon for [employer],  DO.  Documentation: I have reviewed the above documentation for accuracy and completeness, and I agree with the above.  Delon Lenis, DO

## 2024-06-12 ENCOUNTER — Ambulatory Visit

## 2024-06-12 DIAGNOSIS — R55 Syncope and collapse: Secondary | ICD-10-CM | POA: Diagnosis not present

## 2024-06-12 LAB — CUP PACEART REMOTE DEVICE CHECK
Date Time Interrogation Session: 20250910234446
Implantable Pulse Generator Implant Date: 20241112

## 2024-06-20 NOTE — Progress Notes (Signed)
 Remote Loop Recorder Transmission

## 2024-06-21 ENCOUNTER — Other Ambulatory Visit: Payer: Self-pay | Admitting: Dermatology

## 2024-06-21 ENCOUNTER — Ambulatory Visit: Payer: Self-pay | Admitting: Cardiovascular Disease

## 2024-06-21 DIAGNOSIS — L209 Atopic dermatitis, unspecified: Secondary | ICD-10-CM

## 2024-06-26 NOTE — Progress Notes (Signed)
 Remote Loop Recorder Transmission

## 2024-07-10 NOTE — Progress Notes (Signed)
 Remote Loop Recorder Transmission

## 2024-07-14 ENCOUNTER — Ambulatory Visit (INDEPENDENT_AMBULATORY_CARE_PROVIDER_SITE_OTHER)

## 2024-07-14 ENCOUNTER — Encounter

## 2024-07-14 DIAGNOSIS — I4729 Other ventricular tachycardia: Secondary | ICD-10-CM | POA: Diagnosis not present

## 2024-07-14 LAB — CUP PACEART REMOTE DEVICE CHECK
Date Time Interrogation Session: 20251012234501
Implantable Pulse Generator Implant Date: 20241112

## 2024-07-15 NOTE — Progress Notes (Signed)
 Remote Loop Recorder Transmission

## 2024-07-28 ENCOUNTER — Ambulatory Visit: Payer: Self-pay | Admitting: Cardiovascular Disease

## 2024-08-12 ENCOUNTER — Other Ambulatory Visit: Payer: Self-pay

## 2024-08-12 DIAGNOSIS — L209 Atopic dermatitis, unspecified: Secondary | ICD-10-CM

## 2024-08-12 MED ORDER — DUPIXENT 300 MG/2ML ~~LOC~~ SOAJ
300.0000 mg | SUBCUTANEOUS | 11 refills | Status: AC
Start: 2024-08-12 — End: ?

## 2024-08-14 ENCOUNTER — Encounter

## 2024-08-14 ENCOUNTER — Ambulatory Visit

## 2024-08-14 DIAGNOSIS — I4729 Other ventricular tachycardia: Secondary | ICD-10-CM | POA: Diagnosis not present

## 2024-08-14 LAB — CUP PACEART REMOTE DEVICE CHECK
Date Time Interrogation Session: 20251112234210
Implantable Pulse Generator Implant Date: 20241112

## 2024-08-19 NOTE — Progress Notes (Signed)
 Remote Loop Recorder Transmission

## 2024-09-01 ENCOUNTER — Ambulatory Visit: Payer: Self-pay | Admitting: Cardiovascular Disease

## 2024-09-11 ENCOUNTER — Other Ambulatory Visit: Payer: Self-pay

## 2024-09-11 DIAGNOSIS — L308 Other specified dermatitis: Secondary | ICD-10-CM

## 2024-09-11 MED ORDER — ZORYVE 0.3 % EX CREA: 1.0000 | TOPICAL_CREAM | Freq: Two times a day (BID) | CUTANEOUS | 5 refills | Status: AC

## 2024-09-11 NOTE — Progress Notes (Signed)
 Pt LVM stating that she needed a new prescription for Zorvye cream as [facility] stated that she is out of refills.

## 2024-09-14 ENCOUNTER — Ambulatory Visit

## 2024-09-14 DIAGNOSIS — I4729 Other ventricular tachycardia: Secondary | ICD-10-CM | POA: Diagnosis not present

## 2024-09-15 ENCOUNTER — Encounter

## 2024-09-16 LAB — CUP PACEART REMOTE DEVICE CHECK
Date Time Interrogation Session: 20251213233408
Implantable Pulse Generator Implant Date: 20241112

## 2024-09-19 NOTE — Progress Notes (Signed)
 Remote Loop Recorder Transmission

## 2024-10-01 ENCOUNTER — Ambulatory Visit: Payer: Self-pay | Admitting: Cardiovascular Disease

## 2024-10-09 ENCOUNTER — Telehealth: Payer: Self-pay

## 2024-10-09 NOTE — Telephone Encounter (Signed)
 Patient Called stating she received a text message from [facility] stating they were trying to fill her prescription. Patient states she spoke with [facility] and advised she was filing her rx with [facility] and the script with them Francesco) needed to be cancelled. I completed a conference call with [facility] to schedule her delivery. Tia from [facility] stated it looks like her rx from [facility] was recently filled so she would not be able to fill the script with [facility] until January 20th,2026.   I contacted Dawn from [facility] via message and advised the script needed to be cancelled. Dawn advised she will cancel the script. Patient instructed to call [facility] on January 20th to schedule delivery. Patient voiced understanding.

## 2024-10-14 ENCOUNTER — Other Ambulatory Visit: Payer: Self-pay | Admitting: Family Medicine

## 2024-10-14 DIAGNOSIS — Z1231 Encounter for screening mammogram for malignant neoplasm of breast: Secondary | ICD-10-CM

## 2024-10-15 ENCOUNTER — Ambulatory Visit

## 2024-10-15 DIAGNOSIS — I4729 Other ventricular tachycardia: Secondary | ICD-10-CM | POA: Diagnosis not present

## 2024-10-15 LAB — CUP PACEART REMOTE DEVICE CHECK
Date Time Interrogation Session: 20260113234228
Implantable Pulse Generator Implant Date: 20241112

## 2024-10-17 ENCOUNTER — Ambulatory Visit: Payer: Self-pay | Admitting: Cardiovascular Disease

## 2024-10-20 NOTE — Progress Notes (Signed)
 Remote Loop Recorder Transmission

## 2024-10-22 ENCOUNTER — Other Ambulatory Visit: Payer: Self-pay

## 2024-10-22 ENCOUNTER — Ambulatory Visit: Admitting: Allergy

## 2024-10-22 ENCOUNTER — Encounter: Payer: Self-pay | Admitting: Allergy

## 2024-10-22 VITALS — BP 116/78 | HR 60 | Temp 98.1°F | Resp 18 | Ht 63.0 in | Wt 152.4 lb

## 2024-10-22 DIAGNOSIS — J3089 Other allergic rhinitis: Secondary | ICD-10-CM

## 2024-10-22 DIAGNOSIS — H1013 Acute atopic conjunctivitis, bilateral: Secondary | ICD-10-CM

## 2024-10-22 DIAGNOSIS — J302 Other seasonal allergic rhinitis: Secondary | ICD-10-CM

## 2024-10-22 MED ORDER — AZELASTINE HCL 0.1 % NA SOLN: 2.0000 | Freq: Two times a day (BID) | NASAL | 12 refills | Status: AC

## 2024-10-22 MED ORDER — LEVOCETIRIZINE DIHYDROCHLORIDE 5 MG PO TABS: 5.0000 mg | ORAL_TABLET | Freq: Every day | ORAL | 5 refills | Status: AC

## 2024-10-22 MED ORDER — BEPOTASTINE BESILATE 1.5 % OP SOLN: 1.0000 [drp] | Freq: Two times a day (BID) | OPHTHALMIC | 5 refills | Status: AC | PRN

## 2024-10-22 NOTE — Progress Notes (Signed)
 "   Follow-up Note  RE: [name] MRN: [id number] DOB: 02-25-1951 Date of Office Visit: 10/22/2024   History of present illness: Sandra Mccoy is a 74 y.o. female presenting today for follow-up of allergic rhinitis with conjunctivitis.  She was last seen in the office on 04/17/2024 by myself.  She presents today with her daughter. Discussed the use of AI scribe software for clinical note transcription with the patient, who gave verbal consent to proceed.  Her environmental allergies have been stable over the fall and winter months with no major flare-ups.  She uses Flonase  nasal spray less frequently, sometimes less than three times a week, due to occasional epistaxis. She administers it based on symptoms, particularly when experiencing nasal congestion and stuffiness.  Astepro  (azelastine ) nasal spray is used as needed for rhinorrhea and throat clearing. She has increased its use this week due to more frequent throat clearing, which she attributes to being out more and exposure to cold weather. She used it this morning and prior to that last week.  She consistently uses eye drops for her ocular symptoms, applying them both morning and night, as she finds her eye symptoms to be more bothersome than other allergy  symptoms.  She has been on Dupixent  since March or April of the previous year, which has helped with her skin condition.       Review of systems: 10pt ROS negative unless noted in HPI  Past medical/social/surgical/family history have been reviewed and are unchanged unless specifically indicated below.  No changes  Medication List: Current Outpatient Medications  Medication Sig Dispense Refill   azelastine  (ASTELIN ) 0.1 % nasal spray Place 2 sprays into both nostrils 2 (two) times daily. Use in each nostril as directed 30 mL 12   Bepotastine  Besilate 1.5 % SOLN PLACE 1 DROP INTO BOTH EYES 2 (TWO) TIMES DAILY AS NEEDED (ITCHY/WATERY EYES). 10 mL 5   diltiazem  (CARDIZEM  LA) 120  MG 24 hr tablet Take 120 mg by mouth daily.     DUPIXENT  300 MG/2ML SOAJ Inject 300 mg into the skin every 14 (fourteen) days. 4 mL 11   ezetimibe  (ZETIA ) 10 MG tablet Take 10 mg by mouth every morning.     Fluocinolone  Acetonide Scalp 0.01 % OIL Use on affected areas of the scalp for itch relief as needed. May use as scalp treatment 2-3hours prior to washing. 118.28 mL 3   fluticasone  (FLONASE ) 50 MCG/ACT nasal spray Place 2 sprays into both nostrils 2 (two) times daily as needed for allergies or rhinitis. 16 g 5   furosemide  (LASIX ) 20 MG tablet Take 1 tablet (20 mg total) by mouth daily. 90 tablet 3   GARLIC PO Take 1 tablet by mouth at bedtime.     Ginger, Zingiber officinalis, (GINGER PO) Take 1 tablet by mouth at bedtime.     hydrOXYzine  (ATARAX ) 10 MG tablet Take 1 tablet (10 mg total) by mouth 3 (three) times daily as needed for itching. 30 tablet 0   levocetirizine (XYZAL ) 5 MG tablet Take 1 tablet (5 mg total) by mouth daily. 30 tablet 5   levothyroxine  (SYNTHROID ) 100 MCG tablet Take 100 mcg by mouth every morning.     Multiple Vitamin (MULTIVITAMIN WITH MINERALS) TABS tablet Take 1 tablet by mouth every morning.     olmesartan  (BENICAR ) 40 MG tablet Take 1 tablet (40 mg total) by mouth daily. 90 tablet 3   Roflumilast  (ZORYVE ) 0.3 % CREA Apply 1 Application topically 2 (two) times daily.  60 g 5   rosuvastatin  (CRESTOR ) 20 MG tablet TAKE 1 TABLET BY MOUTH EVERY DAY 90 tablet 1   No current facility-administered medications for this visit.     Known medication allergies: Allergies[1]   Physical examination: Blood pressure 116/78, pulse 60, temperature 98.1 F (36.7 C), temperature source Temporal, resp. rate 18, height 5' 3 (1.6 m), weight 152 lb 6.4 oz (69.1 kg), SpO2 100%.  General: Alert, interactive, in no acute distress. HEENT: PERRLA, TMs pearly gray, turbinates non-edematous without discharge, post-pharynx non erythematous. Neck: Supple without  lymphadenopathy. Lungs: Clear to auscultation without wheezing, rhonchi or rales. {no increased work of breathing. CV: Normal S1, S2 without murmurs. Abdomen: Nondistended, nontender. Skin: Warm and dry, without lesions or rashes. Extremities:  No clubbing, cyanosis or edema. Neuro:   Grossly intact.  Diagnostics/Labs: None today   Assessment and plan: Allergic rhinitis with conjunctivitis - continue avoidance measures for grasses, weeds, trees, indoor molds, outdoor molds, dust mites, and cat. - Continue taking:  Xyzal  (levocetirizine) 5mg  tablet once daily.   Astepro  (azelastine ) 2 sprays per nostril 2 times daily for drainage.  Flonase  (fluticasone ) 1-2 sprays per nostril 3 days (M, W, F) for congestion control.   With using nasal sprays point tip of bottle toward eye on same side nostril and lean head slightly forward for best technique.   Bepreve one drop per eye twice a day as needed for itchy/watery eyes.  - You can use an extra dose of the antihistamine, if needed, for breakthrough symptoms.  - Recommend use of nasal saline spray periodically in the week to help moisturize the nose.  Consider nasal saline rinses 1-2 times daily to remove allergens from the nasal cavities as well as help with mucous clearance (this is especially helpful to do before the nasal sprays are given) - Allergy  shots re-train and reset the immune system to ignore environmental allergens and decrease the resulting immune response to those allergens (sneezing, itchy watery eyes, runny nose, nasal congestion, etc).    - Allergy  shots improve symptoms in 75-85% of patients.  - We can discuss more at a future appointment if the medications are not working for you.   Follow-up in 6 months or sooner if needed  I appreciate the opportunity to take part in Kahley's care. Please do not hesitate to contact me with questions.  Sincerely,   Danita Brain, MD Allergy /Immunology [facility]      [1]  Allergies Allergen Reactions   Lipitor [Atorvastatin] Nausea And Vomiting and Other (See Comments)    Made her loopy   Simvastatin Other (See Comments)    Increased LFT's   "

## 2024-10-22 NOTE — Patient Instructions (Signed)
-   continue avoidance measures for grasses, weeds, trees, indoor molds, outdoor molds, dust mites, and cat. - Continue taking:  Xyzal (levocetirizine) 5mg  tablet once daily.   Astepro (azelastine) 2 sprays per nostril 2 times daily for drainage.  Flonase (fluticasone) 1-2 sprays per nostril 3 days (M, W, F) for congestion control.   With using nasal sprays point tip of bottle toward eye on same side nostril and lean head slightly forward for best technique.   Bepreve one drop per eye twice a day as needed for itchy/watery eyes.  - You can use an extra dose of the antihistamine, if needed, for breakthrough symptoms.  - Recommend use of nasal saline spray periodically in the week to help moisturize the nose.  Consider nasal saline rinses 1-2 times daily to remove allergens from the nasal cavities as well as help with mucous clearance (this is especially helpful to do before the nasal sprays are given) - Allergy shots "re-train" and "reset" the immune system to ignore environmental allergens and decrease the resulting immune response to those allergens (sneezing, itchy watery eyes, runny nose, nasal congestion, etc).    - Allergy shots improve symptoms in 75-85% of patients.  - We can discuss more at a future appointment if the medications are not working for you.   Follow-up in 6 months or sooner if needed

## 2024-11-03 ENCOUNTER — Other Ambulatory Visit: Payer: Self-pay

## 2024-11-03 DIAGNOSIS — E782 Mixed hyperlipidemia: Secondary | ICD-10-CM

## 2024-11-04 MED ORDER — ROSUVASTATIN CALCIUM 20 MG PO TABS: 20.0000 mg | ORAL_TABLET | Freq: Every day | ORAL | 1 refills | Status: AC

## 2024-11-10 ENCOUNTER — Ambulatory Visit

## 2024-11-15 ENCOUNTER — Encounter

## 2024-11-18 ENCOUNTER — Ambulatory Visit: Admitting: Dermatology

## 2024-12-15 ENCOUNTER — Ambulatory Visit: Admitting: Dermatology

## 2024-12-16 ENCOUNTER — Encounter

## 2025-01-16 ENCOUNTER — Encounter

## 2025-04-29 ENCOUNTER — Ambulatory Visit: Admitting: Allergy
